# Patient Record
Sex: Female | Born: 1950 | Race: White | Hispanic: No | Smoking: Former smoker
Health system: Southern US, Community
[De-identification: ages and names within clinical notes are randomized; demographics above are authoritative.]

## PROBLEM LIST (undated history)

## (undated) DIAGNOSIS — IMO0002 Reserved for concepts with insufficient information to code with codable children: Secondary | ICD-10-CM

## (undated) DIAGNOSIS — K219 Gastro-esophageal reflux disease without esophagitis: Secondary | ICD-10-CM

## (undated) DIAGNOSIS — R569 Unspecified convulsions: Secondary | ICD-10-CM

## (undated) DIAGNOSIS — I639 Cerebral infarction, unspecified: Secondary | ICD-10-CM

## (undated) DIAGNOSIS — K121 Other forms of stomatitis: Secondary | ICD-10-CM

## (undated) DIAGNOSIS — G459 Transient cerebral ischemic attack, unspecified: Secondary | ICD-10-CM

## (undated) DIAGNOSIS — F419 Anxiety disorder, unspecified: Secondary | ICD-10-CM

## (undated) DIAGNOSIS — E785 Hyperlipidemia, unspecified: Secondary | ICD-10-CM

## (undated) DIAGNOSIS — T7840XA Allergy, unspecified, initial encounter: Secondary | ICD-10-CM

## (undated) DIAGNOSIS — J45909 Unspecified asthma, uncomplicated: Secondary | ICD-10-CM

## (undated) DIAGNOSIS — M1990 Unspecified osteoarthritis, unspecified site: Secondary | ICD-10-CM

## (undated) DIAGNOSIS — N189 Chronic kidney disease, unspecified: Secondary | ICD-10-CM

## (undated) DIAGNOSIS — T782XXA Anaphylactic shock, unspecified, initial encounter: Secondary | ICD-10-CM

## (undated) DIAGNOSIS — D649 Anemia, unspecified: Secondary | ICD-10-CM

## (undated) HISTORY — DX: Chronic kidney disease, unspecified: N18.9

## (undated) HISTORY — DX: Hyperlipidemia, unspecified: E78.5

## (undated) HISTORY — DX: Anemia, unspecified: D64.9

## (undated) HISTORY — DX: Unspecified convulsions: R56.9

## (undated) HISTORY — DX: Allergy, unspecified, initial encounter: T78.40XA

## (undated) HISTORY — DX: Cerebral infarction, unspecified: I63.9

## (undated) HISTORY — DX: Reserved for concepts with insufficient information to code with codable children: IMO0002

## (undated) HISTORY — DX: Other forms of stomatitis: K12.1

## (undated) HISTORY — DX: Anxiety disorder, unspecified: F41.9

## (undated) HISTORY — DX: Gastro-esophageal reflux disease without esophagitis: K21.9

## (undated) HISTORY — DX: Unspecified osteoarthritis, unspecified site: M19.90

## (undated) HISTORY — DX: Unspecified asthma, uncomplicated: J45.909

## (undated) HISTORY — DX: Anaphylactic shock, unspecified, initial encounter: T78.2XXA

---

## 1994-07-01 LAB — HM PAP SMEAR: HM Pap smear: NORMAL

## 2018-02-03 LAB — TSH: TSH: 1.7 (ref 0.41–5.90)

## 2018-02-03 LAB — CBC: RBC: 4.2 (ref 3.87–5.11)

## 2018-02-03 LAB — HEPATIC FUNCTION PANEL
ALT: 8 (ref 7–35)
AST: 12 — AB (ref 13–35)
Bilirubin, Total: 0.3

## 2018-02-03 LAB — BASIC METABOLIC PANEL
BUN: 23 — AB (ref 4–21)
CO2: 24 — AB (ref 13–22)
Chloride: 104 (ref 99–108)
Creatinine: 1.5 — AB (ref <=1.1)
Glucose: 107
Potassium: 4.7 (ref 3.4–5.3)
Sodium: 143 (ref 137–147)

## 2018-02-03 LAB — LIPID PANEL
Cholesterol: 329 — AB (ref 0–200)
HDL: 40 (ref 35–70)
Triglycerides: 476 — AB (ref 40–160)

## 2018-02-03 LAB — CBC AND DIFFERENTIAL
HCT: 38 (ref 36–46)
Hemoglobin: 12.1 (ref 12.0–16.0)
Platelets: 302 (ref 150–399)
WBC: 7.4

## 2018-02-03 LAB — COMPREHENSIVE METABOLIC PANEL
Albumin: 4.9 (ref 3.5–5.0)
Calcium: 9.7 (ref 8.7–10.7)

## 2018-10-12 DIAGNOSIS — R197 Diarrhea, unspecified: Secondary | ICD-10-CM | POA: Insufficient documentation

## 2018-10-22 DIAGNOSIS — D72829 Elevated white blood cell count, unspecified: Secondary | ICD-10-CM | POA: Insufficient documentation

## 2018-10-22 DIAGNOSIS — E8729 Other acidosis: Secondary | ICD-10-CM | POA: Insufficient documentation

## 2018-10-22 DIAGNOSIS — E870 Hyperosmolality and hypernatremia: Secondary | ICD-10-CM | POA: Insufficient documentation

## 2018-10-22 DIAGNOSIS — D75839 Thrombocytosis, unspecified: Secondary | ICD-10-CM | POA: Insufficient documentation

## 2018-10-28 DIAGNOSIS — F3110 Bipolar disorder, current episode manic without psychotic features, unspecified: Secondary | ICD-10-CM | POA: Insufficient documentation

## 2018-10-28 DIAGNOSIS — R109 Unspecified abdominal pain: Secondary | ICD-10-CM | POA: Insufficient documentation

## 2019-05-31 ENCOUNTER — Ambulatory Visit (INDEPENDENT_AMBULATORY_CARE_PROVIDER_SITE_OTHER): Payer: Medicare Other | Admitting: Family Medicine

## 2019-05-31 ENCOUNTER — Other Ambulatory Visit: Payer: Self-pay

## 2019-05-31 ENCOUNTER — Encounter: Payer: Self-pay | Admitting: Family Medicine

## 2019-05-31 VITALS — BP 131/86 | HR 90 | Temp 98.7°F | Ht 65.0 in | Wt 139.8 lb

## 2019-05-31 DIAGNOSIS — K219 Gastro-esophageal reflux disease without esophagitis: Secondary | ICD-10-CM | POA: Insufficient documentation

## 2019-05-31 DIAGNOSIS — Z0000 Encounter for general adult medical examination without abnormal findings: Secondary | ICD-10-CM

## 2019-05-31 DIAGNOSIS — G40909 Epilepsy, unspecified, not intractable, without status epilepticus: Secondary | ICD-10-CM | POA: Diagnosis not present

## 2019-05-31 DIAGNOSIS — J302 Other seasonal allergic rhinitis: Secondary | ICD-10-CM | POA: Diagnosis not present

## 2019-05-31 DIAGNOSIS — R5383 Other fatigue: Secondary | ICD-10-CM | POA: Insufficient documentation

## 2019-05-31 DIAGNOSIS — G43709 Chronic migraine without aura, not intractable, without status migrainosus: Secondary | ICD-10-CM | POA: Diagnosis not present

## 2019-05-31 NOTE — Patient Instructions (Addendum)
Fasting labwork Take blood pressure early morning

## 2019-05-31 NOTE — Progress Notes (Signed)
New Patient Office Visit  Subjective:  Patient ID: Cassandra Hayden, female    DOB: 06-24-1951  Age: 68 y.o. MRN: [id number]  CC:  Chief Complaint  Patient presents with  . Establish Care  . Hypertension    blood pressure fluctuating     HPI Cassandra AyeCarol Allum presents for asthma-anaphylaxis  to canola oil-allergy testing-epi pen needed-no fast food GERD-prilosec Allergies-Claritin Seizures-tetanic seizures-Magnesium, Calcium, Potassium-altered mental status -sepsis-ICU [place]-3/20 moved to [place]-4/20  Seizure (CMS-HCC) (Primary Dx);  Encephalopathy;  Sinus tachycardia;  High anion gap metabolic acidosis;  Hypernatremia;  HypomagnesemiaSeizure (CMS-HCC) (Primary Dx);  Encephalopathy;  Sinus tachycardia;  High anion gap metabolic acidosis;  Hypernatremia;  Hypomagnesemia Back pain-fx 12/19-sciatica -Gabapentin 100mg  1am, 2 pm  10/27/18-bipolar disorder with suicide threat after husband died. Diagnosed in 1990. +FH dad- bipolar disorder Pt with medication in the past-hospitalized for 6 weeks voluntary-Lamictal started in April-no seizures since July.   Psy -regular visit-monthly, counseling-pt starting a phone counseling-psychology   Diagnoses:  Principal Problem: Bipolar affective disorder, current episode manic (CMS-HCC)   Stressors: [place] passed away February 2020, [place] [facility] [place] [facility] [place], does not like current living situation, conflict with adult children  Patient and patient's family/support system have been informed of the commitment process at [facility]. They are aware that, if petitioned for commitment, the patient will be referred to all available psychiatric facilities which may result in out of county hospitalization  PLAN:  -- Safety Concerns: We recommend that, following any necessary medical clearance, the patient be admitted to an inpatient psychiatric unit for safety, stabilization and treatment.  -- Disposition: Will admit to Available Psychiatric  Facility.  -- Admission Status: Involuntary- This has been explained to the patient or appropriate surrogate. 1st QPE completed; please call hospital police if patient attempts to leave..  -- Further Work-up: Deferred  -- Psychiatric Interventions:   - Continue currently prescribed Trazodone 50mg  PO qHS to target insomnia - Continue currently prescribed Atarax 25mg  PO q6h PRN to target anxiety - HOLD Xanax 0.25mg  PO qHS     Past Medical History:  Diagnosis Date  . Anxiety   . Arthritis   . Asthma   . GERD (gastroesophageal reflux disease)   . Seizures (HCC)   . Ulcer (traumatic) of oral mucosa abdominal    PSH-oral surgery  Family History  Problem Relation Age of Onset  . Cancer Mother   . Hyperlipidemia Mother   . Hypertension Mother   . Heart disease Father   . Mental illness Father     died of pancreatic cancer-Mom Died from heart disease-Dad Social History  Live alone-6 children-no longer living at home Socioeconomic History  . Marital status: Widowed    Spouse name: Not on file  . Number of children: Not on file  . Years of education: Not on file  . Highest education level: Not on file  Occupational History  . Occupation: retired  Engineer, productionocial Needs  . Financial resource strain: Not on file  . Food insecurity    Worry: Not on file    Inability: Not on file  . Transportation needs    Medical: Not on file    Non-medical: Not on file  Tobacco Use  . Smoking status: Never Smoker  . Smokeless tobacco: Never Used  Substance and Sexual Activity  . Alcohol use: Never    Frequency: Never  . Drug use: Never  . Sexual activity: Not Currently  Lifestyle  . Physical activity    Days per week: Not  on file    Minutes per session: Not on file  . Stress: Not on file  Relationships  . Social Herbalist on phone: Not on file    Gets together: Not on file    Attends religious service: Not on file    Active member of club or organization: Not on file     Attends meetings of clubs or organizations: Not on file    Relationship status: Not on file  . Intimate partner violence    Fear of current or ex partner: Not on file    Emotionally abused: Not on file    Physically abused: Not on file    Forced sexual activity: Not on file  Other Topics Concern  . Not on file  Social History Narrative  . Not on file    ROS Review of Systems  Constitutional: Positive for fatigue.  HENT: Negative.   Eyes: Negative.   Respiratory: Negative.   Gastrointestinal: Negative.   Endocrine: Negative.   Genitourinary: Negative.   Musculoskeletal: Positive for arthralgias, back pain and myalgias.  Skin: Negative.   Allergic/Immunologic: Positive for environmental allergies and food allergies.  Neurological: Positive for seizures and headaches.       Tetanic    Objective:   Today's Vitals: BP 131/86 (BP Location: Left Arm, Patient Position: Sitting, Cuff Size: Normal)   Pulse 90   Temp 98.7 F (37.1 C) (Oral)   Ht 5\' 5"  (1.651 m)   Wt 139 lb 12.8 oz (63.4 kg)   SpO2 94%   BMI 23.26 kg/m   Physical Exam Vitals signs reviewed.  Constitutional:      Appearance: Normal appearance.  HENT:     Head: Normocephalic and atraumatic.  Neck:     Musculoskeletal: Normal range of motion and neck supple.  Cardiovascular:     Rate and Rhythm: Normal rate and regular rhythm.     Pulses: Normal pulses.     Heart sounds: Normal heart sounds.  Pulmonary:     Effort: Pulmonary effort is normal.     Breath sounds: Normal breath sounds.  Musculoskeletal: Normal range of motion.  Neurological:     Mental Status: She is alert and oriented to person, place, and time.  Psychiatric:        Mood and Affect: Mood normal.        Behavior: Behavior normal.     Assessment & Plan:  1. Seizure disorder ([place]) Seen by neuro-lamictal 2. Chronic migraine without aura without status migrainosus, not intractable Propranolol-stable-decrease in frequency and  intensity  3. Seasonal allergic rhinitis, unspecified trigger claritin  4. Gastroesophageal reflux disease without esophagitis Omeprazole-rx-stable - COMPLETE METABOLIC PANEL WITH GFR - CBC with Differential  5. Hypomagnesemia - COMPLETE METABOLIC PANEL WITH GFR - Magnesium  6. Health care maintenance - Lipid panel  7. Fatigue, unspecified type - TSH Outpatient Encounter Medications as of 05/31/2019  Medication Sig  . B Complex-C (SUPER B COMPLEX PO) Take by mouth.  . Cholecalciferol (D-3-5) 125 MCG (5000 UT) capsule Take 5,000 Units by mouth daily.  Marland Kitchen gabapentin (NEURONTIN) 100 MG capsule Take 100 mg by mouth 2 (two) times daily.  Marland Kitchen lamoTRIgine (LAMICTAL) 100 MG tablet Take 100 mg by mouth daily.  Marland Kitchen LORATADINE PO Take 10 mg by mouth daily.  . magnesium gluconate (MAGONATE) 500 MG tablet Take 500 mg by mouth 2 (two) times daily.  . Multiple Vitamins-Minerals (MULTIVITAMIN WITH MINERALS) tablet Take 1 tablet by mouth daily.  Marland Kitchen  omeprazole (PRILOSEC) 20 MG capsule Take 20 mg by mouth daily.  . propranolol (INDERAL) 80 MG tablet Take 80 mg by mouth daily.  Marland Kitchen tiZANidine (ZANAFLEX) 4 MG capsule Take 4 mg by mouth 3 (three) times daily.  . vitamin E 400 UNIT capsule Take 400 Units by mouth daily.   No facility-administered encounter medications on file as of 05/31/2019.     Follow-up:  LISA Mat Carne, MD

## 2019-06-22 LAB — COMPLETE METABOLIC PANEL WITH GFR
AG Ratio: 1.7 (calc) (ref 1.0–2.5)
ALT: 9 U/L (ref 6–29)
AST: 15 U/L (ref 10–35)
Albumin: 4.3 g/dL (ref 3.6–5.1)
Alkaline phosphatase (APISO): 52 U/L (ref 37–153)
BUN/Creatinine Ratio: 11 (calc) (ref 6–22)
BUN: 16 mg/dL (ref 7–25)
CO2: 25 mmol/L (ref 20–32)
Calcium: 9.9 mg/dL (ref 8.6–10.4)
Chloride: 107 mmol/L (ref 98–110)
Creat: 1.41 mg/dL — ABNORMAL HIGH (ref 0.60–0.93)
GFR, Est African American: 44 mL/min/{1.73_m2} — ABNORMAL LOW (ref >=60)
GFR, Est Non African American: 38 mL/min/{1.73_m2} — ABNORMAL LOW (ref >=60)
Globulin: 2.6 g/dL (calc) (ref 1.9–3.7)
Glucose, Bld: 91 mg/dL (ref 65–99)
Potassium: 3.6 mmol/L (ref 3.5–5.3)
Sodium: 144 mmol/L (ref 135–146)
Total Bilirubin: 0.6 mg/dL (ref 0.2–1.2)
Total Protein: 6.9 g/dL (ref 6.1–8.1)

## 2019-06-22 LAB — LIPID PANEL
Cholesterol: 296 mg/dL — ABNORMAL HIGH (ref <200)
HDL: 43 mg/dL — ABNORMAL LOW (ref >=50)
LDL Cholesterol (Calc): 208 mg/dL (calc) — ABNORMAL HIGH
Non-HDL Cholesterol (Calc): 253 mg/dL (calc) — ABNORMAL HIGH (ref <130)
Total CHOL/HDL Ratio: 6.9 (calc) — ABNORMAL HIGH (ref <5.0)
Triglycerides: 252 mg/dL — ABNORMAL HIGH (ref <150)

## 2019-06-22 LAB — CBC WITH DIFFERENTIAL/PLATELET
Absolute Monocytes: 428 cells/uL (ref 200–950)
Basophils Absolute: 41 cells/uL (ref 0–200)
Basophils Relative: 0.6 %
Eosinophils Absolute: 317 cells/uL (ref 15–500)
Eosinophils Relative: 4.6 %
HCT: 34.2 % — ABNORMAL LOW (ref 35.0–45.0)
Hemoglobin: 11.3 g/dL — ABNORMAL LOW (ref 11.7–15.5)
Lymphs Abs: 2139 cells/uL (ref 850–3900)
MCH: 28 pg (ref 27.0–33.0)
MCHC: 33 g/dL (ref 32.0–36.0)
MCV: 84.9 fL (ref 80.0–100.0)
MPV: 9.3 fL (ref 7.5–12.5)
Monocytes Relative: 6.2 %
Neutro Abs: 3974 cells/uL (ref 1500–7800)
Neutrophils Relative %: 57.6 %
Platelets: 292 10*3/uL (ref 140–400)
RBC: 4.03 10*6/uL (ref 3.80–5.10)
RDW: 13.4 % (ref 11.0–15.0)
Total Lymphocyte: 31 %
WBC: 6.9 10*3/uL (ref 3.8–10.8)

## 2019-06-22 LAB — TSH: TSH: 0.77 mIU/L (ref 0.40–4.50)

## 2019-06-22 LAB — MAGNESIUM: Magnesium: 1.8 mg/dL (ref 1.5–2.5)

## 2019-06-23 ENCOUNTER — Telehealth (INDEPENDENT_AMBULATORY_CARE_PROVIDER_SITE_OTHER): Payer: Medicare Other | Admitting: Family Medicine

## 2019-06-23 ENCOUNTER — Other Ambulatory Visit: Payer: Self-pay

## 2019-06-23 ENCOUNTER — Other Ambulatory Visit: Payer: Self-pay | Admitting: Family Medicine

## 2019-06-23 VITALS — BP 84/47 | Temp 98.6°F | Wt 124.0 lb

## 2019-06-23 DIAGNOSIS — E7849 Other hyperlipidemia: Secondary | ICD-10-CM

## 2019-06-23 MED ORDER — PROPRANOLOL HCL 40 MG PO TABS: 40.0000 mg | ORAL_TABLET | Freq: Three times a day (TID) | ORAL | 1 refills | Status: DC

## 2019-06-23 NOTE — Telephone Encounter (Signed)
Requested medication (s) are due for refill today: yes  Requested medication (s) are on the active medication list: yes Last refill:  06/23/2019  Future visit scheduled: yes  Notes to clinic:  requesting 90 day supply   Requested Prescriptions  Pending Prescriptions Disp Refills   propranolol (INDERAL) 40 MG tablet [Pharmacy Med Name: PROPRANOLOL 40MG  TABLETS] 270 tablet     Sig: TAKE 1 TABLET(40 MG) BY MOUTH THREE TIMES DAILY      There is no refill protocol information for this order

## 2019-06-24 DIAGNOSIS — E7849 Other hyperlipidemia: Secondary | ICD-10-CM | POA: Insufficient documentation

## 2019-06-24 NOTE — Progress Notes (Signed)
Virtual Visit via Telephone Note  I connected with Henrene Pastor on 06/23/19 at 10:00 AM EST by telephone and verified that I am speaking with the correct person using two identifiers.DOB/Address  Location: Patient: home Provider: office   I discussed the limitations, risks, security and privacy concerns of performing an evaluation and management service by telephone and the availability of in person appointments. I also discussed with the patient that there may be a patient responsible charge related to this service. The patient expressed understanding and agreed to proceed.   History of Present Illness: Elevated cholesterol noted on labwork   Observations/Objective: Reviewed labwork Assessment and Plan:  1. Other hyperlipidemia Pt declines medication-states" I will not take statins" -cardiac risk discussed-+FH elevated cholesterol Follow Up Instructions: Suggest DEXA/cologuard-pt declined colonoscopy referral   I discussed the assessment and treatment plan with the patient. The patient was provided an opportunity to ask questions and all were answered. The patient declines treatment  I provided 8 minutes of non-face-to-face time during this encounter.   Aiko Belko Hannah Beat, MD

## 2019-08-02 ENCOUNTER — Ambulatory Visit: Payer: Medicare Other | Attending: Internal Medicine

## 2019-08-02 ENCOUNTER — Other Ambulatory Visit: Payer: Self-pay

## 2019-08-02 DIAGNOSIS — Z20822 Contact with and (suspected) exposure to covid-19: Secondary | ICD-10-CM

## 2019-08-03 LAB — NOVEL CORONAVIRUS, NAA: SARS-CoV-2, NAA: NOT DETECTED

## 2019-11-15 DIAGNOSIS — N185 Chronic kidney disease, stage 5: Secondary | ICD-10-CM | POA: Insufficient documentation

## 2019-11-15 DIAGNOSIS — J302 Other seasonal allergic rhinitis: Secondary | ICD-10-CM | POA: Insufficient documentation

## 2019-11-15 DIAGNOSIS — Z87891 Personal history of nicotine dependence: Secondary | ICD-10-CM | POA: Insufficient documentation

## 2019-11-15 DIAGNOSIS — K279 Peptic ulcer, site unspecified, unspecified as acute or chronic, without hemorrhage or perforation: Secondary | ICD-10-CM | POA: Insufficient documentation

## 2019-11-16 DIAGNOSIS — I2510 Atherosclerotic heart disease of native coronary artery without angina pectoris: Secondary | ICD-10-CM | POA: Insufficient documentation

## 2019-11-29 ENCOUNTER — Ambulatory Visit: Payer: Medicare Other | Admitting: Family Medicine

## 2019-12-01 ENCOUNTER — Other Ambulatory Visit: Payer: Self-pay | Admitting: Family Medicine

## 2019-12-01 DIAGNOSIS — Z780 Asymptomatic menopausal state: Secondary | ICD-10-CM

## 2019-12-01 DIAGNOSIS — Z13820 Encounter for screening for osteoporosis: Secondary | ICD-10-CM

## 2020-02-16 ENCOUNTER — Other Ambulatory Visit: Payer: Medicare Other

## 2020-08-08 DIAGNOSIS — D8989 Other specified disorders involving the immune mechanism, not elsewhere classified: Secondary | ICD-10-CM | POA: Insufficient documentation

## 2021-03-12 ENCOUNTER — Encounter (HOSPITAL_COMMUNITY): Payer: Self-pay | Admitting: *Deleted

## 2021-03-12 ENCOUNTER — Other Ambulatory Visit: Payer: Self-pay

## 2021-03-12 ENCOUNTER — Inpatient Hospital Stay (HOSPITAL_COMMUNITY)
Admission: EM | Admit: 2021-03-12 | Discharge: 2021-03-13 | DRG: 065 | Discharge status: Against Medical Advice | Payer: Medicare Other | Attending: Internal Medicine | Admitting: Internal Medicine

## 2021-03-12 ENCOUNTER — Emergency Department (HOSPITAL_COMMUNITY): Payer: Medicare Other

## 2021-03-12 DIAGNOSIS — N1831 Chronic kidney disease, stage 3a: Secondary | ICD-10-CM | POA: Diagnosis present

## 2021-03-12 DIAGNOSIS — Z20822 Contact with and (suspected) exposure to covid-19: Secondary | ICD-10-CM | POA: Diagnosis present

## 2021-03-12 DIAGNOSIS — Z8249 Family history of ischemic heart disease and other diseases of the circulatory system: Secondary | ICD-10-CM

## 2021-03-12 DIAGNOSIS — Z888 Allergy status to other drugs, medicaments and biological substances status: Secondary | ICD-10-CM

## 2021-03-12 DIAGNOSIS — Z882 Allergy status to sulfonamides status: Secondary | ICD-10-CM

## 2021-03-12 DIAGNOSIS — G43909 Migraine, unspecified, not intractable, without status migrainosus: Secondary | ICD-10-CM | POA: Diagnosis present

## 2021-03-12 DIAGNOSIS — R29810 Facial weakness: Secondary | ICD-10-CM | POA: Diagnosis present

## 2021-03-12 DIAGNOSIS — N1832 Chronic kidney disease, stage 3b: Secondary | ICD-10-CM | POA: Diagnosis present

## 2021-03-12 DIAGNOSIS — E785 Hyperlipidemia, unspecified: Secondary | ICD-10-CM | POA: Diagnosis present

## 2021-03-12 DIAGNOSIS — Z5329 Procedure and treatment not carried out because of patient's decision for other reasons: Secondary | ICD-10-CM | POA: Diagnosis present

## 2021-03-12 DIAGNOSIS — I639 Cerebral infarction, unspecified: Secondary | ICD-10-CM | POA: Diagnosis not present

## 2021-03-12 DIAGNOSIS — Z83438 Family history of other disorder of lipoprotein metabolism and other lipidemia: Secondary | ICD-10-CM

## 2021-03-12 DIAGNOSIS — R471 Dysarthria and anarthria: Secondary | ICD-10-CM | POA: Diagnosis present

## 2021-03-12 DIAGNOSIS — Z880 Allergy status to penicillin: Secondary | ICD-10-CM

## 2021-03-12 DIAGNOSIS — I6330 Cerebral infarction due to thrombosis of unspecified cerebral artery: Secondary | ICD-10-CM | POA: Insufficient documentation

## 2021-03-12 DIAGNOSIS — I6381 Other cerebral infarction due to occlusion or stenosis of small artery: Secondary | ICD-10-CM | POA: Diagnosis not present

## 2021-03-12 DIAGNOSIS — Z91041 Radiographic dye allergy status: Secondary | ICD-10-CM

## 2021-03-12 DIAGNOSIS — I160 Hypertensive urgency: Secondary | ICD-10-CM | POA: Diagnosis not present

## 2021-03-12 DIAGNOSIS — I129 Hypertensive chronic kidney disease with stage 1 through stage 4 chronic kidney disease, or unspecified chronic kidney disease: Secondary | ICD-10-CM | POA: Diagnosis present

## 2021-03-12 DIAGNOSIS — Z79899 Other long term (current) drug therapy: Secondary | ICD-10-CM

## 2021-03-12 DIAGNOSIS — D631 Anemia in chronic kidney disease: Secondary | ICD-10-CM | POA: Diagnosis present

## 2021-03-12 DIAGNOSIS — K219 Gastro-esophageal reflux disease without esophagitis: Secondary | ICD-10-CM | POA: Diagnosis present

## 2021-03-12 DIAGNOSIS — D649 Anemia, unspecified: Secondary | ICD-10-CM | POA: Diagnosis present

## 2021-03-12 DIAGNOSIS — Z91018 Allergy to other foods: Secondary | ICD-10-CM

## 2021-03-12 DIAGNOSIS — N183 Chronic kidney disease, stage 3 unspecified: Secondary | ICD-10-CM | POA: Diagnosis present

## 2021-03-12 DIAGNOSIS — I959 Hypotension, unspecified: Secondary | ICD-10-CM | POA: Diagnosis present

## 2021-03-12 DIAGNOSIS — G40909 Epilepsy, unspecified, not intractable, without status epilepticus: Secondary | ICD-10-CM

## 2021-03-12 DIAGNOSIS — R29704 NIHSS score 4: Secondary | ICD-10-CM | POA: Diagnosis present

## 2021-03-12 DIAGNOSIS — G8191 Hemiplegia, unspecified affecting right dominant side: Secondary | ICD-10-CM | POA: Diagnosis present

## 2021-03-12 DIAGNOSIS — Z8673 Personal history of transient ischemic attack (TIA), and cerebral infarction without residual deficits: Secondary | ICD-10-CM

## 2021-03-12 HISTORY — DX: Transient cerebral ischemic attack, unspecified: G45.9

## 2021-03-12 LAB — CBG MONITORING, ED: Glucose-Capillary: 91 mg/dL (ref 70–99)

## 2021-03-12 LAB — ETHANOL: Alcohol, Ethyl (B): <10 mg/dL (ref <10)

## 2021-03-12 LAB — PROTIME-INR
INR: 1 (ref 0.8–1.2)
Prothrombin Time: 12.8 seconds (ref 11.4–15.2)

## 2021-03-12 LAB — CBC
HCT: 32 % — ABNORMAL LOW (ref 36.0–46.0)
Hemoglobin: 10.1 g/dL — ABNORMAL LOW (ref 12.0–15.0)
MCH: 28.5 pg (ref 26.0–34.0)
MCHC: 31.6 g/dL (ref 30.0–36.0)
MCV: 90.4 fL (ref 80.0–100.0)
Platelets: 264 10*3/uL (ref 150–400)
RBC: 3.54 MIL/uL — ABNORMAL LOW (ref 3.87–5.11)
RDW: 13 % (ref 11.5–15.5)
WBC: 8 10*3/uL (ref 4.0–10.5)
nRBC: 0 % (ref 0.0–0.2)

## 2021-03-12 LAB — COMPREHENSIVE METABOLIC PANEL
ALT: 11 U/L (ref 0–44)
AST: 17 U/L (ref 15–41)
Albumin: 4 g/dL (ref 3.5–5.0)
Alkaline Phosphatase: 65 U/L (ref 38–126)
Anion gap: 7 (ref 5–15)
BUN: 25 mg/dL — ABNORMAL HIGH (ref 8–23)
CO2: 24 mmol/L (ref 22–32)
Calcium: 8.6 mg/dL — ABNORMAL LOW (ref 8.9–10.3)
Chloride: 108 mmol/L (ref 98–111)
Creatinine, Ser: 1.66 mg/dL — ABNORMAL HIGH (ref 0.44–1.00)
GFR, Estimated: 33 mL/min — ABNORMAL LOW (ref >60)
Glucose, Bld: 96 mg/dL (ref 70–99)
Potassium: 3.9 mmol/L (ref 3.5–5.1)
Sodium: 139 mmol/L (ref 135–145)
Total Bilirubin: 0.5 mg/dL (ref 0.3–1.2)
Total Protein: 7 g/dL (ref 6.5–8.1)

## 2021-03-12 LAB — DIFFERENTIAL
Abs Immature Granulocytes: 0.03 10*3/uL (ref 0.00–0.07)
Basophils Absolute: 0.1 10*3/uL (ref 0.0–0.1)
Basophils Relative: 1 %
Eosinophils Absolute: 0.4 10*3/uL (ref 0.0–0.5)
Eosinophils Relative: 5 %
Immature Granulocytes: 0 %
Lymphocytes Relative: 36 %
Lymphs Abs: 2.9 10*3/uL (ref 0.7–4.0)
Monocytes Absolute: 0.7 10*3/uL (ref 0.1–1.0)
Monocytes Relative: 8 %
Neutro Abs: 3.9 10*3/uL (ref 1.7–7.7)
Neutrophils Relative %: 50 %

## 2021-03-12 LAB — APTT: aPTT: 32 seconds (ref 24–36)

## 2021-03-12 IMAGING — CT CT HEAD CODE STROKE
3 series · 15 of 47 positions shown, 18 images · non-contrast
Comparison: None.

CLINICAL DATA: Code stroke.  Sudden onset weakness

EXAM:
CT HEAD WITHOUT CONTRAST
TECHNIQUE: Contiguous axial images were obtained from the base of the skull
through the vertex without intravenous contrast.

[Series 2: head w o · axial · 0.47mm/px · z∈[+68,+192]mm · 9 of 31 slices shown, 12 images]
[im 3/31  brain]
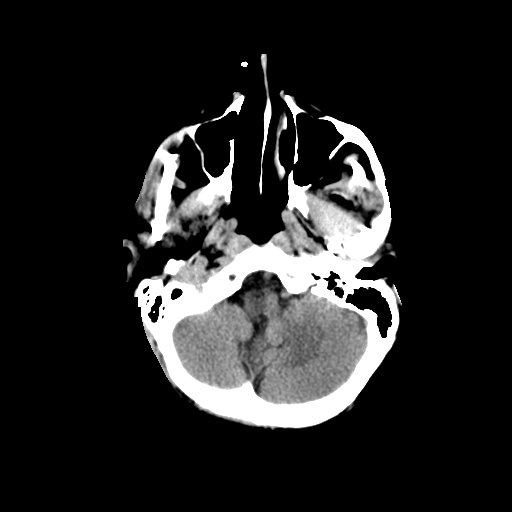
[im 3/31  bone]
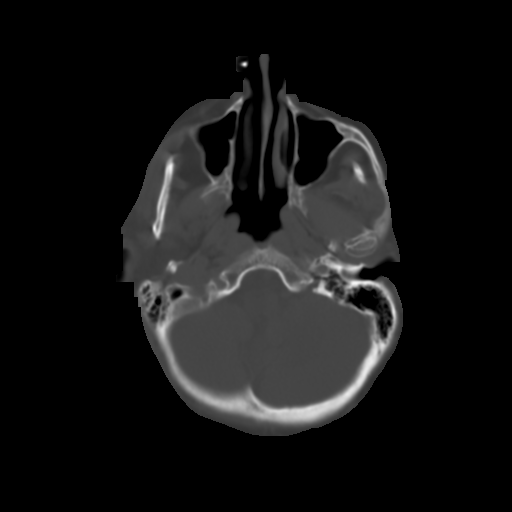
[im 6/31  brain]
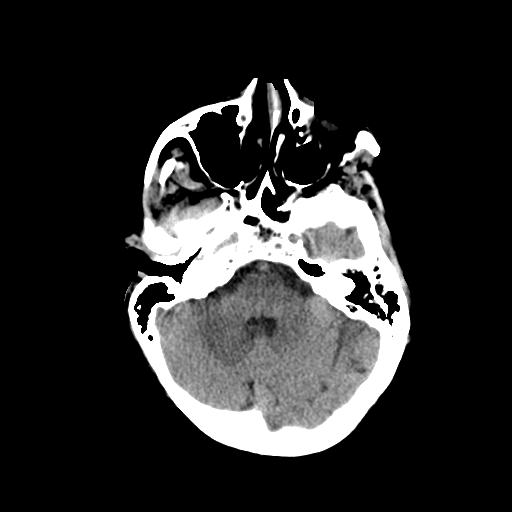
[im 9/31  brain]
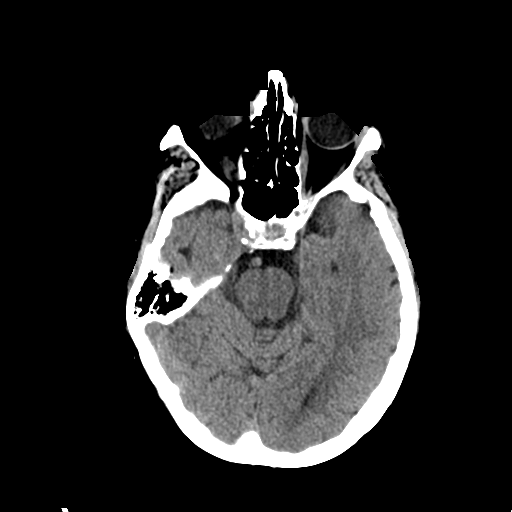
[im 12/31  brain]
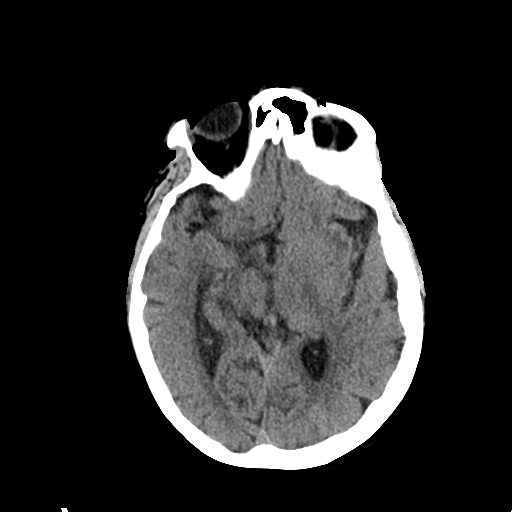
[im 16/31  brain]
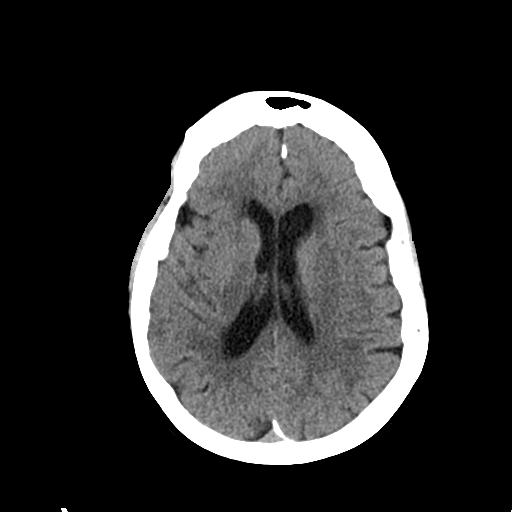
[im 16/31  bone]
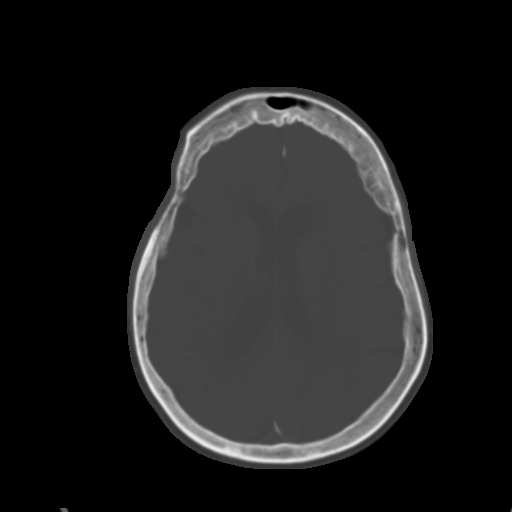
[im 19/31  brain]
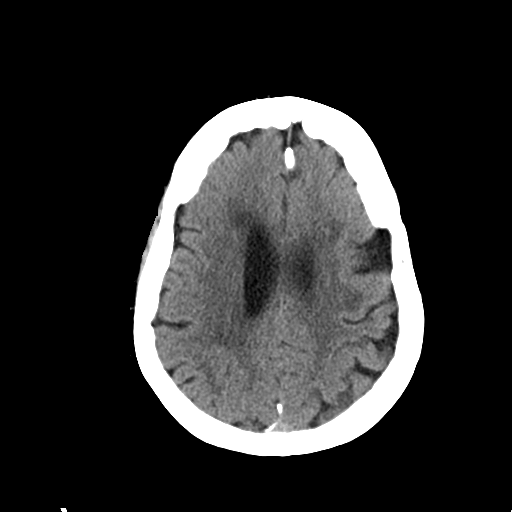
[im 22/31  brain]
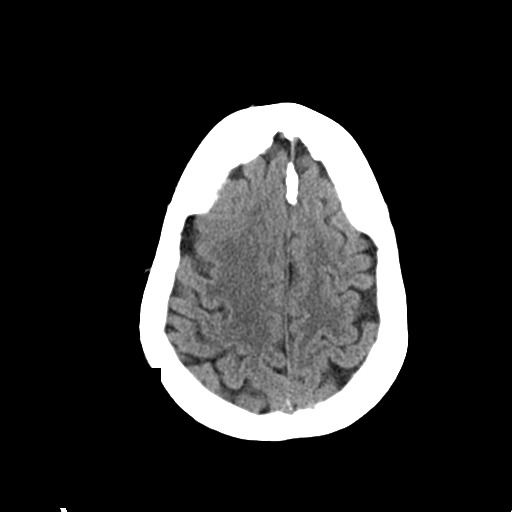
[im 25/31  brain]
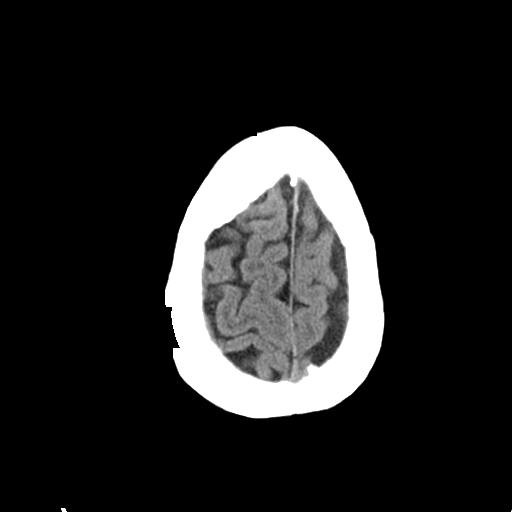
[im 28/31  brain]
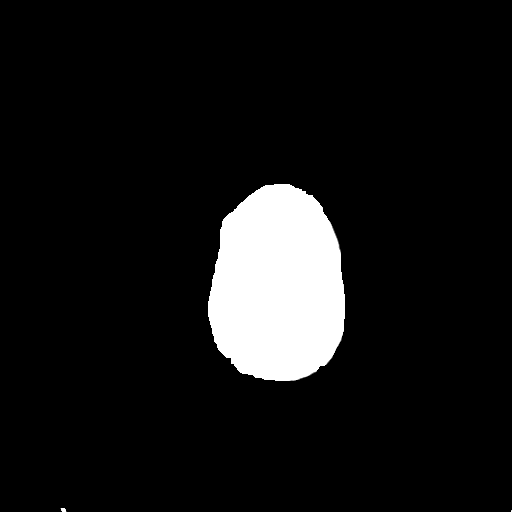
[im 28/31  bone]
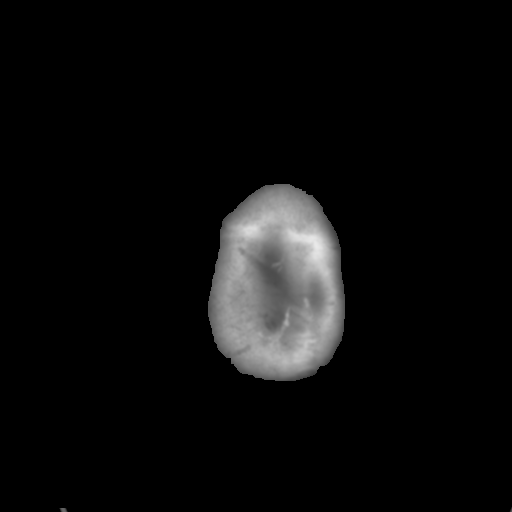

[Series 4: coronal soft · coronal · 0.30mm/px · 3 of 67 slices shown]
[im 23/67  brain]
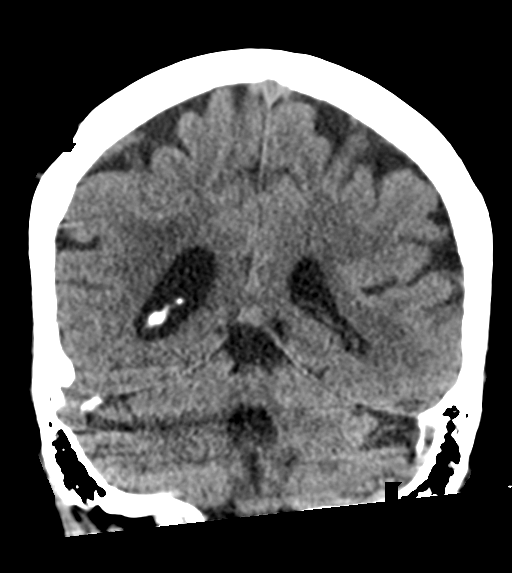
[im 30/67  brain]
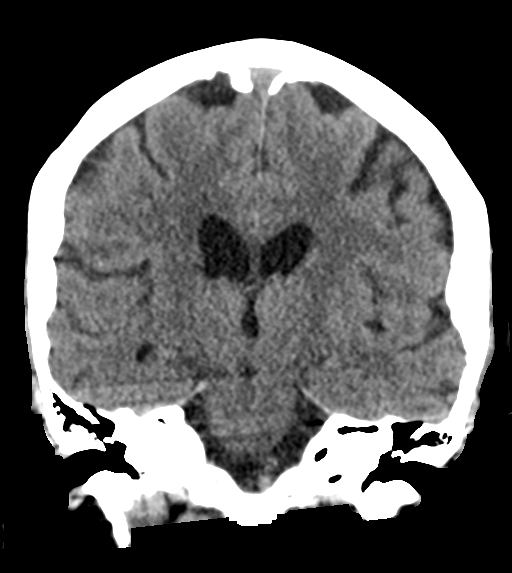
[im 37/67  brain]
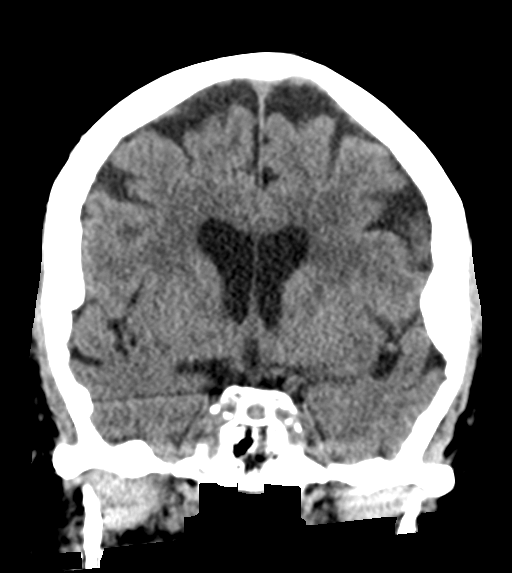

[Series 5: sagittal soft · sagittal · 0.31mm/px · 3 of 49 slices shown]
[im 17/49  brain]
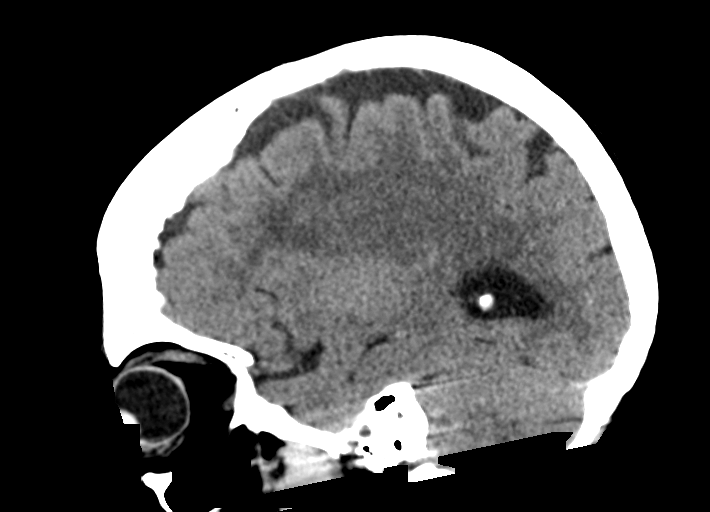
[im 25/49  brain]
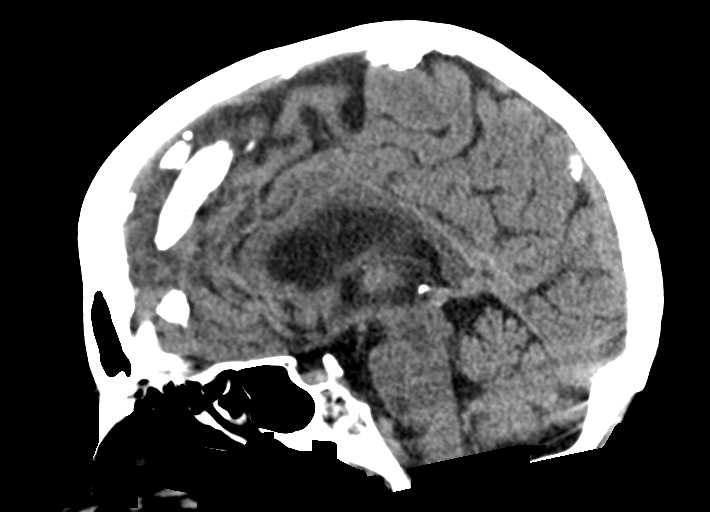
[im 33/49  brain]
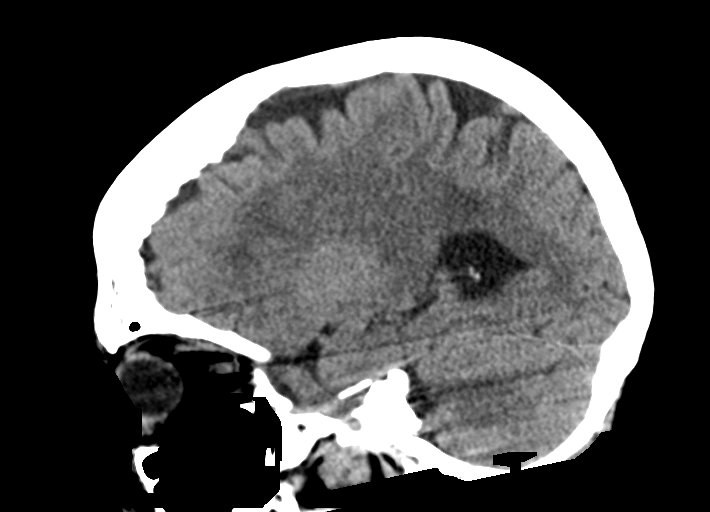

[15 of 47 positions shown; findings below may reference images not displayed]

FINDINGS: Brain: There is no mass, hemorrhage or extra-axial collection. The
size and configuration of the ventricles and extra-axial CSF spaces
are normal. There is hypoattenuation of the periventricular white
matter, most commonly indicating chronic ischemic microangiopathy.

Vascular: No abnormal hyperdensity of the major intracranial
arteries or dural venous sinuses. No intracranial atherosclerosis.

Skull: The visualized skull base, calvarium and extracranial soft
tissues are normal.

Sinuses/Orbits: No fluid levels or advanced mucosal thickening of
the visualized paranasal sinuses. No mastoid or middle ear effusion.
The orbits are normal.

ASPECTS (Alberta Stroke Program Early CT Score)

- Ganglionic level infarction (caudate, lentiform nuclei, internal
capsule, insula, M1-M3 cortex): 7

- Supraganglionic infarction (M4-M6 cortex): 3

Total score (0-10 with 10 being normal): 10
IMPRESSION: 1. Chronic ischemic microangiopathy without acute intracranial
abnormality.
2. ASPECTS is 10.

These results were called by telephone at the time of interpretation
on [DATE] at [DATE] to provider DON LOLITO , who verbally
acknowledged these results.

## 2021-03-12 NOTE — ED Notes (Signed)
SSS not completed r/t NPO for MRI

## 2021-03-12 NOTE — ED Triage Notes (Signed)
Pt with slurred speech and difficultly with walking  about an 1.5 -2 hours.  Lips feel funny and unable to make fist on right

## 2021-03-12 NOTE — Consult Note (Addendum)
Neurology Consultation Reason for Consult: Code stroke  Requesting Physician: Kennis Carina   CC: slurred speech, difficulty walking and right arm weakness   History is obtained from: Patient and chart review, Dr. Pilar Plate  HPI: Cassandra Hayden is a 70 y.o. female with a past medical history significant for seizures, uncontrolled unmedicated hyperlipidemia, TIA, anxiety, iodinated contrast allergy.  She reports she was in her usual state of health until 9 PM -- she had an episode of dizziness at 8:30 PM associated with low BP (70s/40s) which is not unusual for her and at the time she had no focal weakness/numbness. At 9 PM she got out of bed and was trying to drink some fluids to help raise her BP and noticed she was weak on the right side. Denies HA (reports this is unusual as she typically has a constant headache).   She was evaluated by teleneurology and refused TNKase in the setting of not being willing to accept the bleeding risk.  Deficits as reported at this time are right upper extremity weakness and slurred speech, with a total NIH score stroke scale of 4 (one-point for facial droop, one-point for right arm drift, one-point for right leg drift, one-point for dysarthria).  Additional imaging was recommended to exclude large vessel occlusion but given contrast allergy patient again refused and she is being emergently transferred to [facility] for MRI brain and MRA head/neck from [facility] per teleneurologist evaluation and concern that her blurry vision may represent LVO.    Initial vitals on arrival to [facility]: Heart rates 60s to 70s, respiratory rate 16, blood pressure 124/73 (149/63 on later check), 100% on room air, 66.9 kgs. En route per [facility] nicardipine was started for BP goal of 160-180 mmHg which was discontinued on her arrival. She had some brief bilateral blurry vision at the edges of her vision, and initially on my evaluation had bilateral LE drift but with  encouragement the left sided drift resolved and her NIH remained 4.   There was significant delay in MRI due to difficulty removing earrings she reported she had not taken out in 6 years. She additionally refused MRI contrast while in scanner, which we later discussed is not the same contrast as iodine contrast and she reports she has not had gadolinium allergy.  On further discussion regarding her contrast allergy, she reports she still feels like she is drowning even after pretreatment and therefore she does not want iodinated contrast.she notes if her neurological status was to decline and she were to meet criteria for thrombectomy she would want to proceed with thrombectomy, including pretreatment for dye allergy and intubation for the procedure with extubation only once her respiratory status was completely stabilized from the potential of an anaphylactic reaction.  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  She has very strong and clear preferences about which medical procedures she is willing and unwilling to undergo and is able to clearly communicate these preferences.  We did discuss that thrombectomy would put her at risk of needing dialysis again due to her borderline renal function and the amount of contrast dye needed for such a procedure.  Regarding her seizure history she has "electrolyte imbalance seizures" without any recent seizure activity and  had had bleeding stomach ulcer 1 month ago (bloody stool, with severe GI infection in 2020 and ulcers on endoscopy in 2005).   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  ROS: All other review of systems was negative except as noted in the HPI.   Past Medical History:  Diagnosis Date   Anxiety    Arthritis    Asthma    GERD (gastroesophageal reflux disease)    Seizures (HCC)    TIA (transient ischemic attack)    Ulcer (traumatic) of oral mucosa abdominal   History reviewed. No pertinent surgical history.  Current Outpatient Medications  Medication Instructions   B Complex-C (SUPER B COMPLEX PO) Oral   D-3-5 5,000 Units, Oral, Daily   gabapentin (NEURONTIN) 100 mg, Oral, 2 times daily   lamoTRIgine (LAMICTAL) 100 mg, Oral, Daily   LORATADINE PO 10 mg, Oral, Daily   magnesium gluconate (MAGONATE) 500 mg, Oral, 2 times daily   Multiple Vitamins-Minerals (MULTIVITAMIN WITH MINERALS) tablet 1 tablet, Oral, Daily   omeprazole (PRILOSEC) 20 mg, Oral, Daily   propranolol (INDERAL) 40 mg, Oral, 3 times daily   tiZANidine (ZANAFLEX) 4 mg, Oral, 3 times daily   vitamin E 400 Units, Oral, Daily   Notes she does not take cholesterol medication "because I don't want to;" denies prior side effects  Family History  Problem Relation Age of Onset   Cancer Mother    Hyperlipidemia Mother    Hypertension Mother    Heart disease Father    Mental illness Father   -Hyperlipidemia in her son as well   Social History:  reports that she has never smoked. She has never used smokeless tobacco. She reports that she does not drink alcohol and does not use drugs.   Exam: Current vital signs: BP (!) 149/63   Pulse 63   Temp 97.9 F (36.6 C) (Oral)   Resp 18   Ht 5\' 6"  (1.676  m)   Wt 66.9 kg   SpO2 100%   BMI 23.79 kg/m  Vital signs in last 24 hours: Temp:  [97.9 F (36.6 C)] 97.9 F (36.6 C) (09/12 2301) Pulse Rate:  [63-71] 63 (09/12 2330) Resp:  [16-18] 18 (09/12 2330) BP: (124-149)/(63-73) 149/63 (09/12 2330) SpO2:  [100 %] 100 % (09/12 2330) Weight:  [66.9 kg] 66.9 kg (09/12 2314)   Physical Exam  Constitutional: Appears well-developed and well-nourished.  Psych: Affect appropriate to situation, calm and cooperative though often tangential in answers to questions Eyes: No scleral injection HENT: No oropharyngeal obstruction.  MSK: no joint deformities.  Cardiovascular: Normal rate and regular rhythm.  Respiratory: Effort normal, non-labored breathing GI: Soft.  No distension. There is no tenderness.  Skin: Warm dry and intact visible skin  Neuro: Mental Status: Patient is awake, alert, oriented to person, place, month, year, and situation. Patient is able to give a clear and coherent history. No signs of aphasia or neglect Cranial  Nerves: II: Visual Fields are full. Pupils are equal, round, and reactive to light.   III,IV, VI: EOMI without ptosis or diploplia.  V: Facial sensation is symmetric to light touch VII: Facial movement is notable for right facial droop VIII: hearing is intact to voice X: Uvula elevates symmetrically XI: Shoulder shrug is symmetric. XII: tongue is midline without atrophy or fasciculations.  Motor: Tone is normal. Bulk is normal. RUE positive pronator drift. Initially bilateral drift of the lower extremities, then just of the RLE.  Sensory: Sensation is symmetric to light touch  in the arms and legs. Deep Tendon Reflexes: 2+ and symmetric in the biceps and patellae.  Cerebellar: FNF and HKS are intact bilaterally  NIHSS total 4 preformed just prior to MRI brain Score breakdown: 1 point each for facial droop, RUE drift, RLE drift and dysarthria  I have reviewed labs in epic and the results pertinent to this  consultation are: CBC notable for hemoglobin of 10.1 (12.1 in 2019 and 11.3 06/21/2019) Glucose 91 CMP with creatinine of 1.66 (baseline 1.4-1.5), BUN 25, GFR 33 PT/INR/PTT within normal limits Ethanol negative  Respiratory viral panel pending  Lab Results  Component Value Date   CHOL 296 (H) 06/21/2019   HDL 43 (L) 06/21/2019   LDLCALC 208 (H) 06/21/2019   TRIG 252 (H) 06/21/2019   CHOLHDL 6.9 (H) 06/21/2019    I have reviewed the images obtained:  Head CT personally reviewed, chronic microangiopathy without clear acute intracranial process though certainly one could be overlooked in the setting of her white matter disease  MRI brain personally reviewed -- acute left pontine stroke MRA brain personally reviewed --there is a short segment right P1 loss of signal with distal clear flow, possible chronic occlusion versus severe stenosis given patient is not symptomatic from this; additionally there is severe stenosis of the proximal left P2 segment.  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  Severe intracranial atherosclerotic disease as detailed above, with a major risk factor being uncontrolled hyperlipidemia, likely secondary to strong genetic predisposition.  At this time patient does not meet criteria for thrombectomy given her low NIH stroke scale, for which we will take a closely watchful approach in addition to starting dual antiplatelet therapy and statin and allowing for permissive hypertension as below  Recommendations: # Left pontine stroke secondary to atheroembolic etiology versus small vessel disease - Stroke labs TSH, RPR, HgbA1c, fasting lipid panel - Frequent neuro checks, every 1 hour for the next 6 hours given her right P1 occlusion, nursing personally instructed to reactivate code stroke for any acute neurological change and will  reconsider thrombectomy in that setting - Echocardiogram - Prophylactic therapy-Antiplatelet med: Aspirin - dose 325mg  PO or 300mg  PR, followed by 81 mg daily - Plavix 300 mg load with 75 mg daily for 90 day course  - Atorvastatin 80 mg nightly, on an outpatient basis will additionally likely need a PSK 9 inhibitor such as Repatha - Risk factor modification, diet, exercise and medication adherence to statin and likely PSK 9 inhibitor discussed with patient - Telemetry monitoring; 30 day event monitor on discharge if no arrythmias captured  - Blood pressure goal   - Permissive hypertension to 220/120 - Cartoid duplex given non-diagnostic MRA neck and contrast allergy - PT consult, OT consult, Speech consult - Stroke team to follow in consultation, please admit for full  stroke workup  Brooke Dare MD-PhD [facility] [phone] Available 7 PM to 7 AM, outside of these hours please call Neurologist on call as listed on Amion.   Total critical care time: 45 minutes   Critical care time was exclusive of separately billable procedures and treating other patients.   Critical care was necessary to treat or prevent imminent or life-threatening deterioration -- emergent evaluation for potential thrombectomy.    Critical care was time spent personally by me on the following activities: development of treatment plan with patient and/or surrogate as well as nursing, discussions with consultants/primary team, evaluation of patient's response to treatment, examination of patient, obtaining history from patient or surrogate, ordering and performing treatments and interventions, ordering and review of laboratory studies, ordering and review of radiographic studies, and re-evaluation of patient's condition as needed, as documented above.

## 2021-03-12 NOTE — ED Notes (Signed)
Patient refuses CT scan even with prep r/t history of anaphylaxis

## 2021-03-12 NOTE — ED Notes (Signed)
CODE STROKE PAGED 

## 2021-03-12 NOTE — ED Notes (Signed)
[facility] to come get pt to take to [facility]

## 2021-03-12 NOTE — Consult Note (Signed)
[facility] Tele[facility]urology Consult Services   Date of Service:   03/12/2021 23:08:11  Diagnosis:       I63.9 - Cerebrovascular accident (CVA), unspecified mechanism (HCC)  Impression:      70 year old female who presents with dizziness, right weakness and slurred speech. NIHSS 4.   Head CT shows no acute changes.    I'm concerned for an acute stroke. She declined TNK (see below). She will not attempt CTA with pretreatment for anaphylaxis. We will transfer her for urgent MRI/A head and neck and involve NIR if LVO on MRA (MRI is not available at this facility at this time).  Metrics: Last Known Well: 03/12/2021 20:30:01 [facility] Notification Time: 03/12/2021 23:08:11 Arrival Time: 03/12/2021 22:52:47 Stamp Time: 03/12/2021 23:08:11 Initial Response Time: 03/12/2021 23:09:43 Symptoms: dizziness. NIHSS Start Assessment Time: 03/12/2021 23:12:53 Patient is not a candidate for Thrombolytic. Thrombolytic Medical Decision: 03/12/2021 23:21:24 Patient was not deemed candidate for Thrombolytic because of following reasons: Patient and/or Family refused. We discussed that she arrived within window for TNK without clear contraindications. I discussed risks and benefits including the risks of cerebral bleeding and bleeding from her recent gastric ulcers. She was adamant she did not want TNK. This was discussed with her by me and ED physician.   CT head showed no acute hemorrhage or acute core infarct.  ED Physician notified of diagnostic impression and management plan on 03/12/2021 23:32:45  Advanced Imaging: Advanced Imaging Not Completed because:  She had anaphylaxis to contrast dye and felt awful on the pretreatment (she completely refused attempting this again). We will transfer her for urgent MRI/A  ------------------------------------------------------------------------------  History of Present Illness: Patient is a 70 year old Female.  Patient was  brought by private transportation with symptoms of dizziness.  70 year old female who developed dizziness and laid down at 2030 then woke up right-sided heaviness. She cannot make a fist with her right hand. The right leg is giving out. Her right lip is numb. She feels her speech is off. I confirmed she had no symptoms earlier in the day.  She denies visual changes or numbness.  There have not been any seizures recently (she has "electrolyte imbalance seizures").  She has bleeding stomach ulcers 2 months ago.  The patient is not on anticoagulation. The patient has not had any recent surgeries. There is no history of bleeding in the brain. There has not been any history of MI, stroke or significant head trauma in the last 3 months. There has not been any heavy alcohol use, cancer or liver disease that might impact platelets or coagulation studies. They have never been told they have low platelets or coagulation problems. There have not been any recent blood infections or IV drug use that might cause endocarditis.    Past Medical History:      There is NO history of Hypertension      There is NO history of Diabetes Mellitus      There is NO history of Hyperlipidemia      There is NO history of Atrial Fibrillation      There is NO history of Stroke  Anticoagulant use:  No  Antiplatelet use: No  Allergies:  Reviewed Allergies Text: Iodine- She had anaphylaxis to contrast dye and felt awful on the pretreatment (she completely refused attempting this again)     Examination: BP(124/73), Pulse(71), Blood Glucose(91) 1A: Level of Consciousness - Alert; keenly responsive + 0 1B: Ask Month and Age - Both Questions Right + 0 1C: Blink  Eyes & Squeeze Hands - Performs Both Tasks + 0 2: Test Horizontal Extraocular Movements - Normal + 0 3: Test Visual Fields - No Visual Loss + 0 4: Test Facial Palsy (Use Grimace if Obtunded) - Minor paralysis (flat nasolabial fold, smile asymmetry) + 1 5A: Test  Left Arm Motor Drift - No Drift for 10 Seconds + 0 5B: Test Right Arm Motor Drift - Drift, but doesn't hit bed + 1 6A: Test Left Leg Motor Drift - No Drift for 5 Seconds + 0 6B: Test Right Leg Motor Drift - Drift, but doesn't hit bed + 1 7: Test Limb Ataxia (FNF/Heel-Shin) - No Ataxia + 0 8: Test Sensation - Normal; No sensory loss + 0 9: Test Language/Aphasia - Normal; No aphasia + 0 10: Test Dysarthria - Mild-Moderate Dysarthria: Slurring but can be understood + 1 11: Test Extinction/Inattention - No abnormality + 0  NIHSS Score: 4   Pre-Morbid Modified Rankin Scale: 0 Points = No symptoms at all   Patient/Family was informed the Neurology Consult would occur via TeleHealth consult by way of interactive audio and video telecommunications and consented to receiving care in this manner.   Patient is being evaluated for possible acute neurologic impairment and high probability of imminent or life-threatening deterioration. I spent total of 30 minutes providing care to this patient, including time for face to face visit via telemedicine, review of medical records, imaging studies and discussion of findings with providers, the patient and/or family.   Dr Ferdinand Cava   [facility] [phone]  Case [id number]

## 2021-03-12 NOTE — ED Provider Notes (Signed)
[facility]-EMERGENCY DEPT [facility] Emergency Department Provider Note MRN:  [id number]  Arrival date & time: 03/12/21     Chief Complaint   Code Stroke   History of Present Illness   Cassandra Hayden is a 70 y.o. year-old female with a history of seizures, TIA, migraines presenting to the ED with chief complaint of code stroke.  That he was feeling normal at 8:30 PM and then started having some cramping in the leg followed by decreased strength to the right arm.  Also noting some speech disturbance and decreased sensation to the lips.  Feeling dizzy.  Denies headache, no recent fever or illness.  Review of Systems  A complete 10 system review of systems was obtained and all systems are negative except as noted in the HPI and PMH.   Patient's Health History    Past Medical History:  Diagnosis Date   Anxiety    Arthritis    Asthma    GERD (gastroesophageal reflux disease)    Seizures (HCC)    TIA (transient ischemic attack)    Ulcer (traumatic) of oral mucosa abdominal    History reviewed. No pertinent surgical history.  Family History  Problem Relation Age of Onset   Cancer Mother    Hyperlipidemia Mother    Hypertension Mother    Heart disease Father    Mental illness Father     Social History   Socioeconomic History   Marital status: Widowed    Spouse name: Not on file   Number of children: Not on file   Years of education: Not on file   Highest education level: Not on file  Occupational History   Occupation: retired  Tobacco Use   Smoking status: Never   Smokeless tobacco: Never  Substance and Sexual Activity   Alcohol use: Never   Drug use: Never   Sexual activity: Not Currently  Other Topics Concern   Not on file  Social History Narrative   Not on file   Social Determinants of Health   Financial Resource Strain: Not on file  Food Insecurity: Not on file  Transportation Needs: Not on file  Physical Activity: Not on file  Stress: Not on file  Social  Connections: Not on file  Intimate Partner Violence: Not on file     Physical Exam   Vitals:   03/12/21 2301  BP: 124/73  Pulse: 71  Resp: 16  Temp: 97.9 F (36.6 C)  SpO2: 100%    CONSTITUTIONAL: Chronically ill-appearing, NAD NEURO:  Alert and oriented x 3, subtle speech impairment, subtle right pronator drift, normal and symmetric sensation, no visual field cuts EYES:  eyes equal and reactive ENT/NECK:  no LAD, no JVD CARDIO: Regular rate, well-perfused, normal S1 and S2 PULM:  CTAB no wheezing or rhonchi GI/GU:  normal bowel sounds, non-distended, non-tender MSK/SPINE:  No gross deformities, no edema SKIN:  no rash, atraumatic PSYCH:  Appropriate speech and behavior  *Additional and/or pertinent findings included in MDM below  Diagnostic and Interventional Summary    EKG Interpretation  Date/Time:  Monday March 12 2021 22:57:31 EDT Ventricular Rate:  68 PR Interval:  178 QRS Duration: 78 QT Interval:  406 QTC Calculation: 431 R Axis:   1 Text Interpretation: Normal sinus rhythm Normal ECG Confirmed by Kennis Carina [phone]) on 03/12/2021 11:35:47 PM       Labs Reviewed  CBC - Abnormal; Notable for the following components:      Result Value   RBC 3.54 (*)  Hemoglobin 10.1 (*)    HCT 32.0 (*)    All other components within normal limits  RESP PANEL BY RT-PCR (FLU A&B, COVID) ARPGX2  DIFFERENTIAL  ETHANOL  PROTIME-INR  APTT  COMPREHENSIVE METABOLIC PANEL  RAPID URINE DRUG SCREEN, HOSP PERFORMED  URINALYSIS, ROUTINE W REFLEX MICROSCOPIC  CBG MONITORING, ED  I-STAT CHEM 8, ED    CT HEAD CODE STROKE WO CONTRAST  Final Result    MR BRAIN WO CONTRAST    (Results Pending)  MR ANGIO HEAD WO CONTRAST    (Results Pending)  MR Angiogram Neck W or Wo Contrast    (Results Pending)    Medications - No data to display   Procedures  /  Critical Care .Critical Care Performed by: Sabas Sous, MD Authorized by: Sabas Sous, MD   Critical care  provider statement:    Critical care time (minutes):  34   Critical care was necessary to treat or prevent imminent or life-threatening deterioration of the following conditions: Concern for acute ischemic stroke, initiation of code stroke protocol.   Critical care was time spent personally by me on the following activities:  Discussions with consultants, evaluation of patient's response to treatment, examination of patient, ordering and performing treatments and interventions, ordering and review of laboratory studies, ordering and review of radiographic studies, pulse oximetry, re-evaluation of patient's condition, obtaining history from patient or surrogate and review of old charts  ED Course and Medical Decision Making  I have reviewed the triage vital signs, the nursing notes, and pertinent available records from the EMR.  Listed above are laboratory and imaging tests that I personally ordered, reviewed, and interpreted and then considered in my medical decision making (see below for details).  Concern for acute ischemic stroke, onset 1-1/2 hours ago, code stroke initiated.  Also considering migraine or seizure mimics.  Awaiting CT, teleneurology evaluation.  Patient has contrast allergy.     Discussed case with telemetry neurologist and with patient.  We had a group discussion.  I was present during the discussion of risks and benefits of tPA.  Patient is refusing tPA, she is concerned about the risk of bleeding.  She absolutely will not do it.  She is well aware of the consequences of foregoing this medication, including permanent or worsening nature of her stroke.  Patient is agreeable to possible thrombectomy.  However this is complicated by her contrast allergy.  She absolutely refuses any pretreatment for contrast CT imaging, given the history she has with anaphylaxis.  Therefore the only option is to transfer to [facility] for MRI or MRA imaging to see if she has a clot and would be a  candidate for thrombectomy.  Currently does not have any hard signs of large vessel occlusion, NIH of 4, mostly having right arm weakness and dysarthria.  No visual field cuts or aphasia or neglect.  She is aware of the possibility of delay in care given this plan and she is excepting of this.  Case discussed with Dr. Rubin Payor of the [facility] emergency department who accepts patient for transfer.  Dr. Iver Nestle of [facility] neurology also made aware and will follow.  Elmer Sow. Pilar Plate, MD [facility] Emergency Medicine [facility] [email or web address]  Final Clinical Impressions(s) / ED Diagnoses     ICD-10-CM   1. Acute ischemic stroke [facility])  I63.9       ED Discharge Orders     None  Discharge Instructions Discussed with and Provided to Patient:   Discharge Instructions   None       Sabas Sous, MD 03/12/21 2340

## 2021-03-13 ENCOUNTER — Encounter (HOSPITAL_COMMUNITY): Payer: Self-pay | Admitting: Internal Medicine

## 2021-03-13 ENCOUNTER — Emergency Department (HOSPITAL_COMMUNITY): Payer: Medicare Other

## 2021-03-13 ENCOUNTER — Inpatient Hospital Stay (HOSPITAL_COMMUNITY): Payer: Medicare Other

## 2021-03-13 DIAGNOSIS — R471 Dysarthria and anarthria: Secondary | ICD-10-CM | POA: Diagnosis present

## 2021-03-13 DIAGNOSIS — I160 Hypertensive urgency: Secondary | ICD-10-CM | POA: Diagnosis not present

## 2021-03-13 DIAGNOSIS — Z880 Allergy status to penicillin: Secondary | ICD-10-CM | POA: Diagnosis not present

## 2021-03-13 DIAGNOSIS — Z8249 Family history of ischemic heart disease and other diseases of the circulatory system: Secondary | ICD-10-CM | POA: Diagnosis not present

## 2021-03-13 DIAGNOSIS — I639 Cerebral infarction, unspecified: Secondary | ICD-10-CM | POA: Diagnosis present

## 2021-03-13 DIAGNOSIS — I6381 Other cerebral infarction due to occlusion or stenosis of small artery: Secondary | ICD-10-CM | POA: Diagnosis present

## 2021-03-13 DIAGNOSIS — Z20822 Contact with and (suspected) exposure to covid-19: Secondary | ICD-10-CM | POA: Diagnosis present

## 2021-03-13 DIAGNOSIS — Z83438 Family history of other disorder of lipoprotein metabolism and other lipidemia: Secondary | ICD-10-CM | POA: Diagnosis not present

## 2021-03-13 DIAGNOSIS — R29704 NIHSS score 4: Secondary | ICD-10-CM | POA: Diagnosis present

## 2021-03-13 DIAGNOSIS — I6389 Other cerebral infarction: Secondary | ICD-10-CM | POA: Diagnosis not present

## 2021-03-13 DIAGNOSIS — I129 Hypertensive chronic kidney disease with stage 1 through stage 4 chronic kidney disease, or unspecified chronic kidney disease: Secondary | ICD-10-CM | POA: Diagnosis present

## 2021-03-13 DIAGNOSIS — I959 Hypotension, unspecified: Secondary | ICD-10-CM | POA: Diagnosis present

## 2021-03-13 DIAGNOSIS — R29810 Facial weakness: Secondary | ICD-10-CM | POA: Diagnosis present

## 2021-03-13 DIAGNOSIS — G43909 Migraine, unspecified, not intractable, without status migrainosus: Secondary | ICD-10-CM | POA: Diagnosis present

## 2021-03-13 DIAGNOSIS — Z91041 Radiographic dye allergy status: Secondary | ICD-10-CM | POA: Diagnosis not present

## 2021-03-13 DIAGNOSIS — Z5329 Procedure and treatment not carried out because of patient's decision for other reasons: Secondary | ICD-10-CM | POA: Diagnosis present

## 2021-03-13 DIAGNOSIS — I6330 Cerebral infarction due to thrombosis of unspecified cerebral artery: Secondary | ICD-10-CM | POA: Insufficient documentation

## 2021-03-13 DIAGNOSIS — N1831 Chronic kidney disease, stage 3a: Secondary | ICD-10-CM | POA: Diagnosis present

## 2021-03-13 DIAGNOSIS — N183 Chronic kidney disease, stage 3 unspecified: Secondary | ICD-10-CM | POA: Diagnosis present

## 2021-03-13 DIAGNOSIS — Z882 Allergy status to sulfonamides status: Secondary | ICD-10-CM | POA: Diagnosis not present

## 2021-03-13 DIAGNOSIS — Z91018 Allergy to other foods: Secondary | ICD-10-CM | POA: Diagnosis not present

## 2021-03-13 DIAGNOSIS — D649 Anemia, unspecified: Secondary | ICD-10-CM | POA: Diagnosis present

## 2021-03-13 DIAGNOSIS — N1832 Chronic kidney disease, stage 3b: Secondary | ICD-10-CM | POA: Diagnosis present

## 2021-03-13 DIAGNOSIS — Z8673 Personal history of transient ischemic attack (TIA), and cerebral infarction without residual deficits: Secondary | ICD-10-CM | POA: Diagnosis not present

## 2021-03-13 DIAGNOSIS — G8191 Hemiplegia, unspecified affecting right dominant side: Secondary | ICD-10-CM | POA: Diagnosis present

## 2021-03-13 DIAGNOSIS — G40909 Epilepsy, unspecified, not intractable, without status epilepticus: Secondary | ICD-10-CM | POA: Diagnosis present

## 2021-03-13 DIAGNOSIS — D631 Anemia in chronic kidney disease: Secondary | ICD-10-CM | POA: Diagnosis present

## 2021-03-13 DIAGNOSIS — E785 Hyperlipidemia, unspecified: Secondary | ICD-10-CM | POA: Diagnosis present

## 2021-03-13 DIAGNOSIS — Z888 Allergy status to other drugs, medicaments and biological substances status: Secondary | ICD-10-CM | POA: Diagnosis not present

## 2021-03-13 DIAGNOSIS — K219 Gastro-esophageal reflux disease without esophagitis: Secondary | ICD-10-CM | POA: Diagnosis present

## 2021-03-13 LAB — URINALYSIS, ROUTINE W REFLEX MICROSCOPIC
Bacteria, UA: NONE SEEN
Bilirubin Urine: NEGATIVE
Glucose, UA: NEGATIVE mg/dL
Hgb urine dipstick: NEGATIVE
Ketones, ur: NEGATIVE mg/dL
Nitrite: NEGATIVE
Protein, ur: NEGATIVE mg/dL
Specific Gravity, Urine: 1.009 (ref 1.005–1.030)
pH: 8 (ref 5.0–8.0)

## 2021-03-13 LAB — ECHOCARDIOGRAM COMPLETE
Area-P 1/2: 2.69 cm2
Calc EF: 69.5 %
Height: 66 in
P 1/2 time: 573 msec
S' Lateral: 2.2 cm
Single Plane A2C EF: 62.9 %
Single Plane A4C EF: 72.1 %
Weight: 2358.4 oz

## 2021-03-13 LAB — CBC
HCT: 34.9 % — ABNORMAL LOW (ref 36.0–46.0)
Hemoglobin: 11.4 g/dL — ABNORMAL LOW (ref 12.0–15.0)
MCH: 28.6 pg (ref 26.0–34.0)
MCHC: 32.7 g/dL (ref 30.0–36.0)
MCV: 87.7 fL (ref 80.0–100.0)
Platelets: 298 10*3/uL (ref 150–400)
RBC: 3.98 MIL/uL (ref 3.87–5.11)
RDW: 12.9 % (ref 11.5–15.5)
WBC: 7.1 10*3/uL (ref 4.0–10.5)
nRBC: 0 % (ref 0.0–0.2)

## 2021-03-13 LAB — RAPID URINE DRUG SCREEN, HOSP PERFORMED
Amphetamines: NOT DETECTED
Barbiturates: NOT DETECTED
Benzodiazepines: NOT DETECTED
Cocaine: NOT DETECTED
Opiates: NOT DETECTED
Tetrahydrocannabinol: NOT DETECTED

## 2021-03-13 LAB — LIPID PANEL
Cholesterol: 346 mg/dL — ABNORMAL HIGH (ref 0–200)
HDL: 52 mg/dL (ref >40)
LDL Cholesterol: 253 mg/dL — ABNORMAL HIGH (ref 0–99)
Total CHOL/HDL Ratio: 6.7 RATIO
Triglycerides: 207 mg/dL — ABNORMAL HIGH (ref <150)
VLDL: 41 mg/dL — ABNORMAL HIGH (ref 0–40)

## 2021-03-13 LAB — CREATININE, SERUM
Creatinine, Ser: 1.64 mg/dL — ABNORMAL HIGH (ref 0.44–1.00)
GFR, Estimated: 33 mL/min — ABNORMAL LOW (ref >60)

## 2021-03-13 LAB — HEMOGLOBIN A1C
Hgb A1c MFr Bld: 5.2 % (ref 4.8–5.6)
Mean Plasma Glucose: 102.54 mg/dL

## 2021-03-13 LAB — RESP PANEL BY RT-PCR (FLU A&B, COVID) ARPGX2
Influenza A by PCR: NEGATIVE
Influenza B by PCR: NEGATIVE
SARS Coronavirus 2 by RT PCR: NEGATIVE

## 2021-03-13 IMAGING — MR MR MRA HEAD W/O CM
1 series · 19 of 48 positions shown · non-contrast
Comparison: None.

CLINICAL DATA: Dizziness.  Right arm weakness.



[Series 5: 3d cow · axial · 0.5mm · 0.41mm/px · z∈[-49,+32]mm · 19 of 172 slices shown]
[im 1/172]
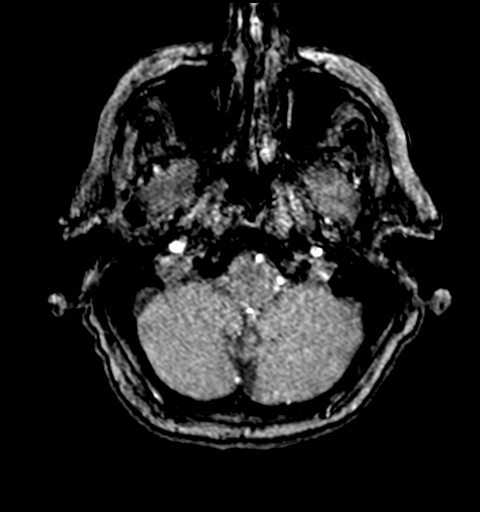
[im 4/172]
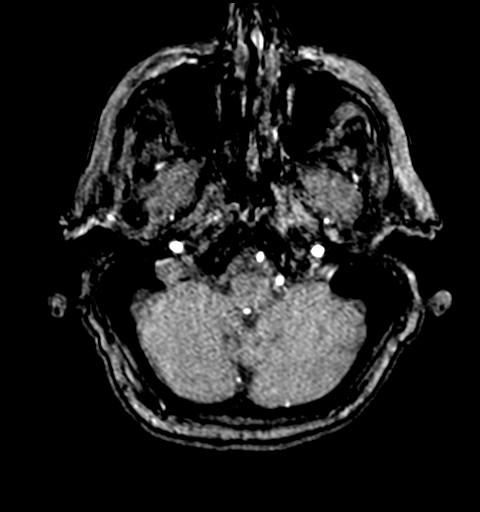
[im 8/172]
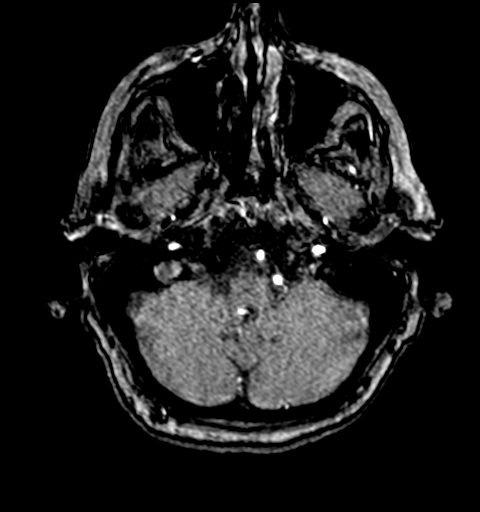
[im 11/172]
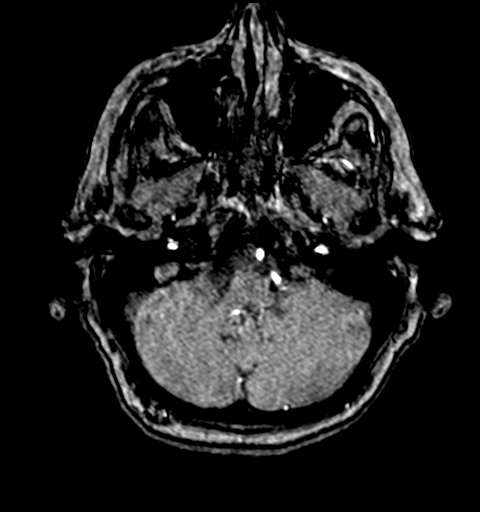
[im 15/172]
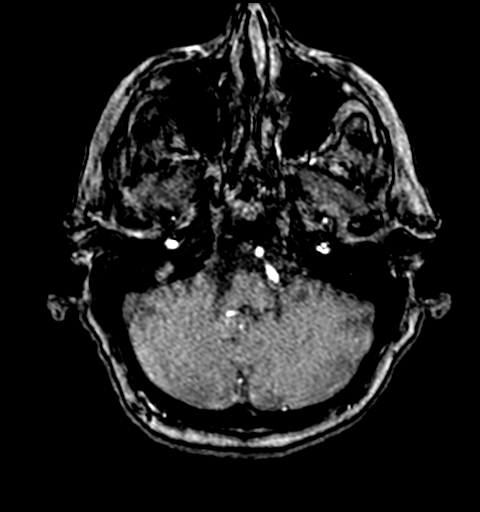
[im 19/172]
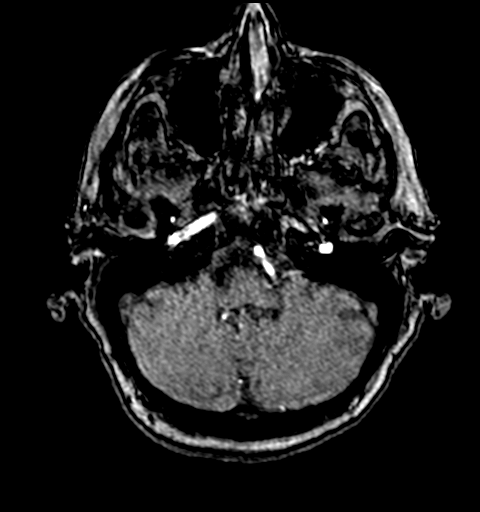
[im 22/172]
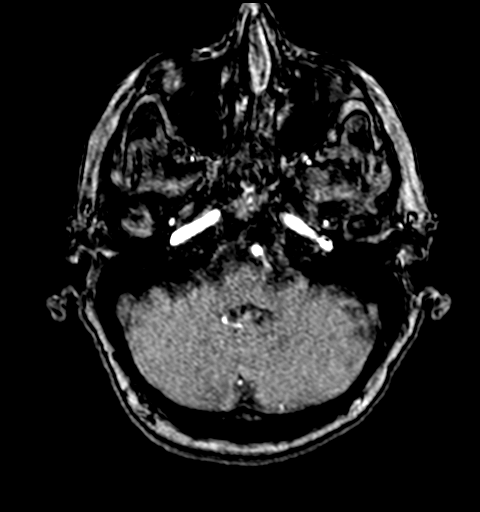
[im 26/172]
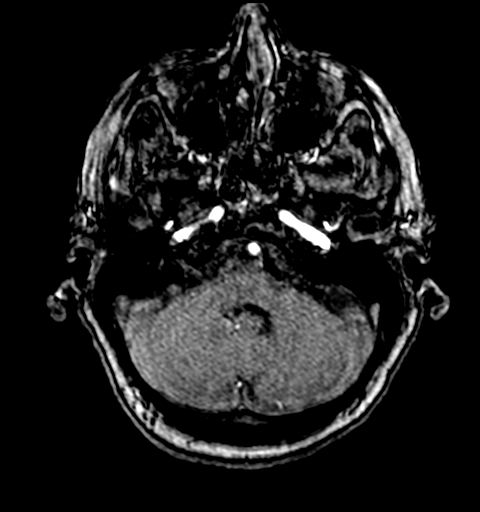
[im 30/172]
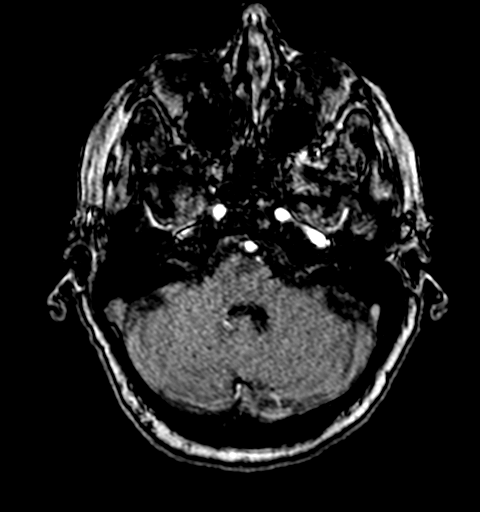
[im 33/172]
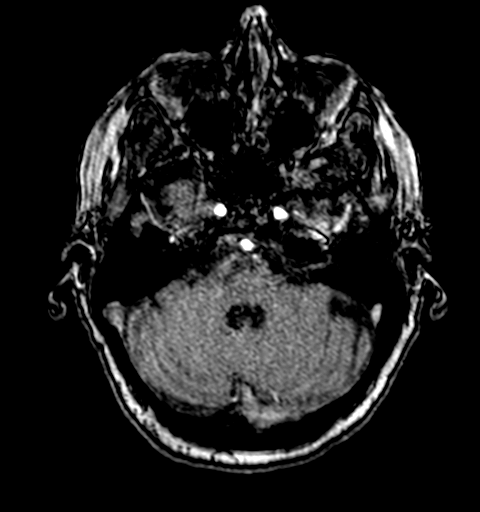
[im 37/172]
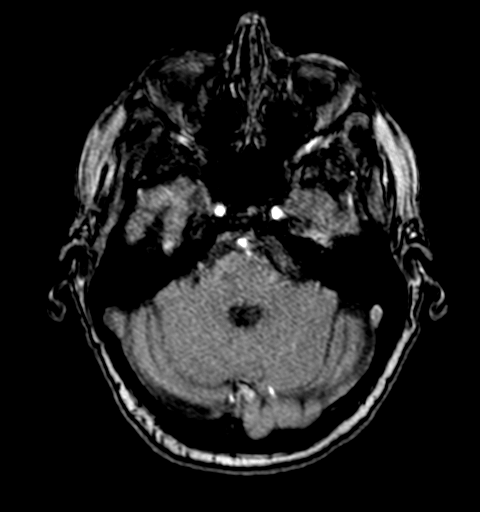
[im 55/172]
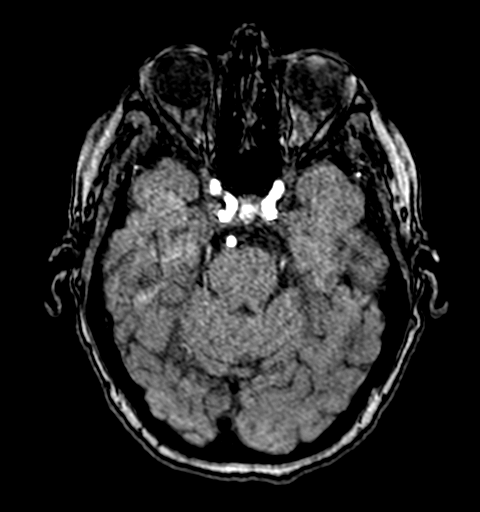
[im 77/172]
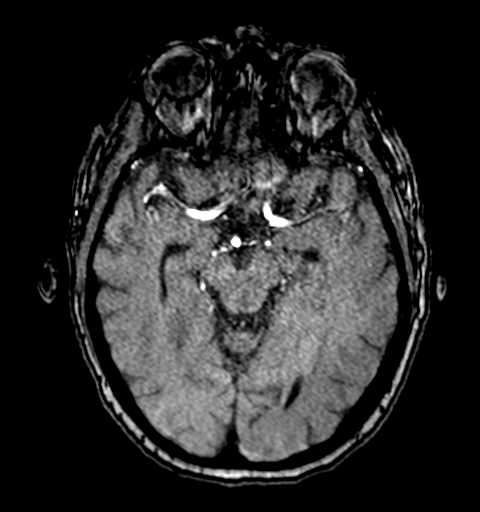
[im 88/172]
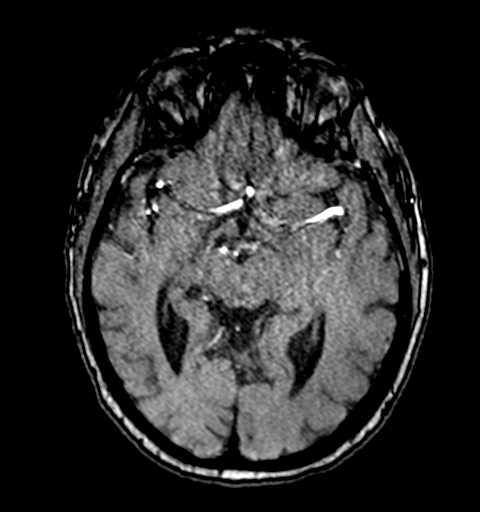
[im 99/172]
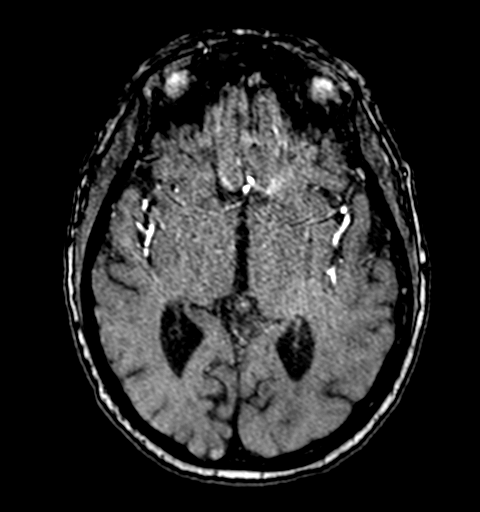
[im 121/172]
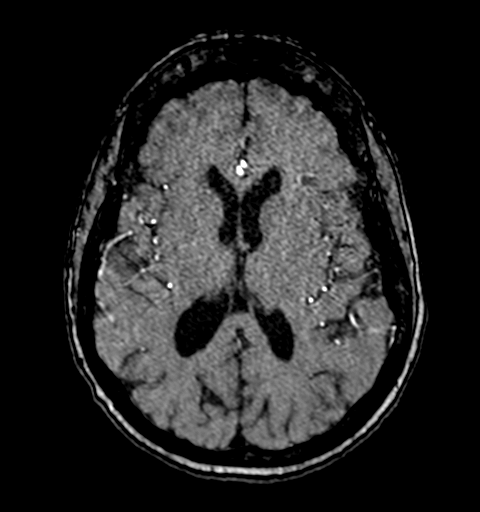
[im 142/172]
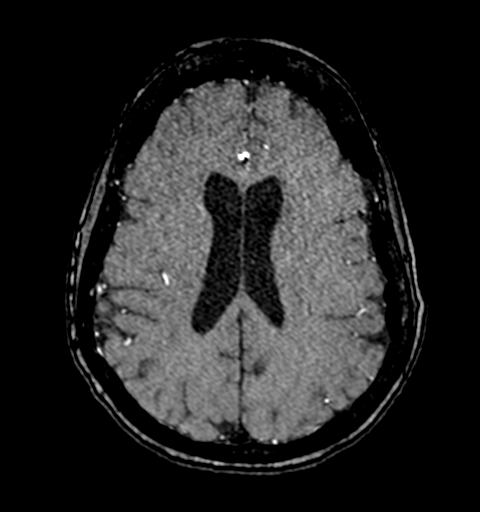
[im 146/172]
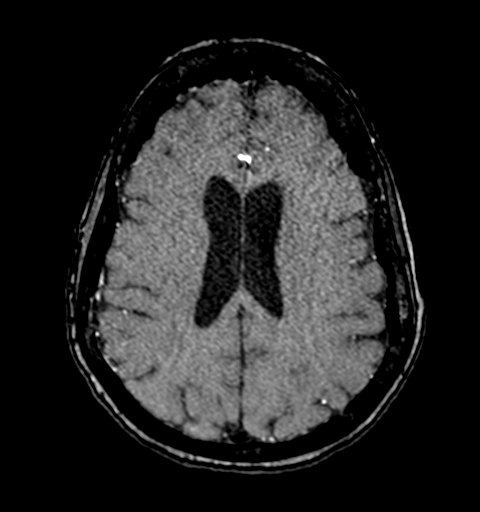
[im 164/172]
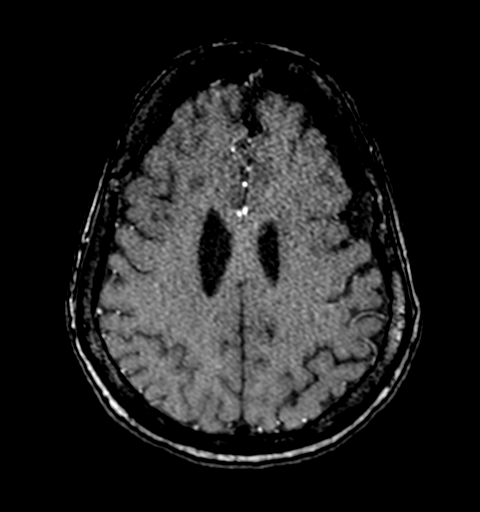

[19 of 48 positions shown; findings below may reference images not displayed]

FINDINGS: MRI HEAD FINDINGS

Brain: Small focus of abnormal diffusion restriction of the ventral
left pons. No other diffusion abnormality. No acute or chronic
hemorrhage. There is multifocal hyperintense T2-weighted signal
within the white matter. Generalized volume loss without a clear
lobar predilection. The midline structures are normal.

Vascular: Major flow voids are preserved.

Skull and upper cervical spine: Normal calvarium and skull base.
Visualized upper cervical spine and soft tissues are normal.

Sinuses/Orbits:No paranasal sinus fluid levels or advanced mucosal
thickening. No mastoid or middle ear effusion. Normal orbits.

MRA HEAD FINDINGS

POSTERIOR CIRCULATION:

--Vertebral arteries: Normal

--Inferior cerebellar arteries: Normal.

--Basilar artery: Normal.

--Superior cerebellar arteries: Normal.

--Posterior cerebral arteries: Short segment occlusion of the right
P1 segment. Distal right PCA is patent. Severe stenosis of the
proximal left P2 segment.

ANTERIOR CIRCULATION:

--Intracranial internal carotid arteries: Normal.

--Anterior cerebral arteries (ACA): Normal.

--Middle cerebral arteries (MCA): Moderate stenosis of the proximal
right M2 segment. Bilateral atherosclerotic irregularity, possibly
exaggerated by motion.

ANATOMIC VARIANTS: None

MRA NECK FINDINGS

The MRA of the neck was not completed due to patient claustrophobia
and inability to tolerate the remainder of the examination. The
obtained images are severely motion degraded.
IMPRESSION: 1. Small focus of acute ischemia of the ventral left pons. No
hemorrhage or mass effect.
2. Short segment occlusion of the right PCA P1 segment. Severe
stenosis of the proximal left P2 segment.
3. Moderate stenosis of the proximal right MCA M2 segment.
4. Severely motion degraded and truncated examination of the neck.

## 2021-03-13 IMAGING — MR MR MRA NECK W/O CM
2 series · 48 of 48 positions shown · non-contrast
Comparison: None.

CLINICAL DATA: Dizziness.  Right arm weakness.



[Series 4: vessel_scout_neck · sagittal · 5.0mm · 1.37mm/px · 24 of 26 slices shown]
[im 1/26]
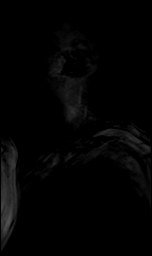
[im 2/26]
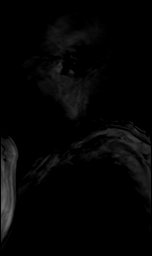
[im 3/26]
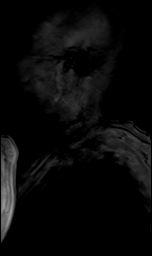
[im 4/26]
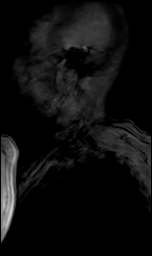
[im 5/26]
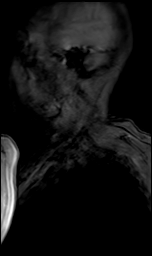
[im 6/26]
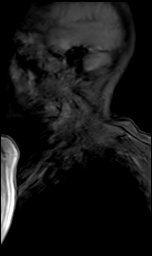
[im 7/26]
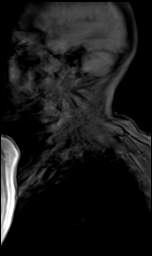
[im 8/26]
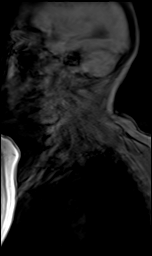
[im 9/26]
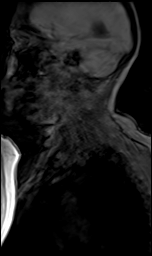
[im 10/26]
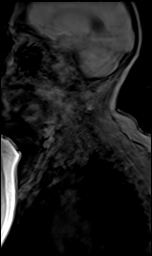
[im 11/26]
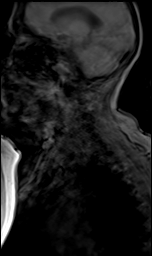
[im 12/26]
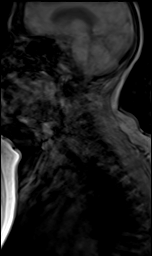
[im 14/26]
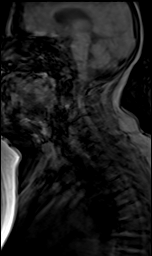
[im 15/26]
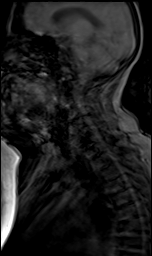
[im 16/26]
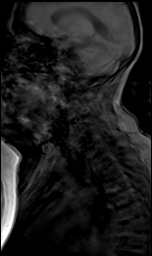
[im 17/26]
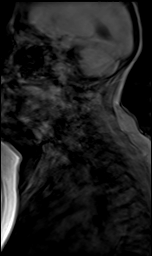
[im 18/26]
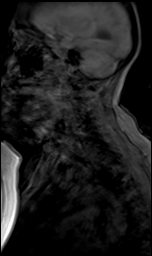
[im 19/26]
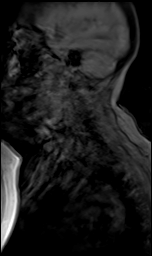
[im 20/26]
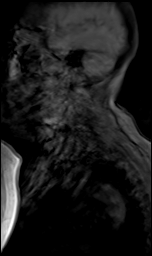
[im 21/26]
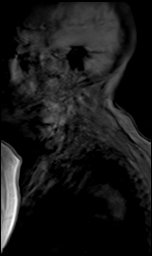
[im 22/26]
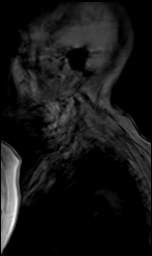
[im 23/26]
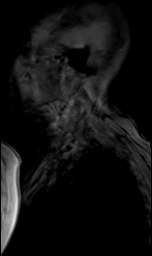
[im 24/26]
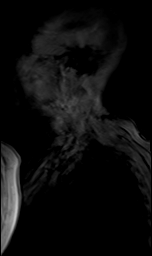
[im 26/26]
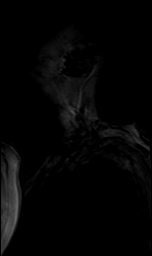

[Series 5: vessel_scout_neck_msum · sagittal · 5.0mm · 1.37mm/px · 24 of 26 slices shown]
[im 1/26]
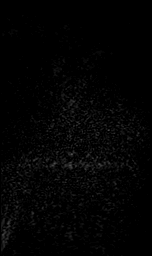
[im 2/26]
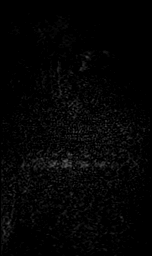
[im 3/26]
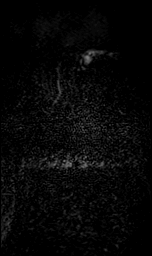
[im 4/26]
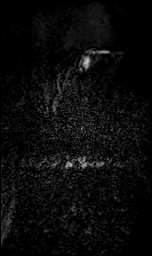
[im 5/26]
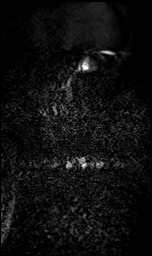
[im 6/26]
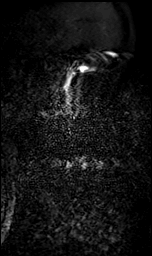
[im 7/26]
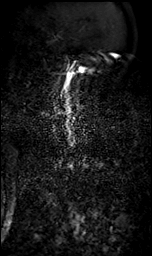
[im 8/26]
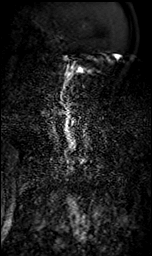
[im 9/26]
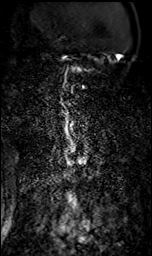
[im 10/26]
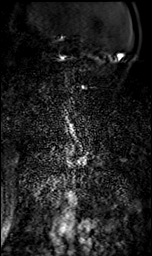
[im 11/26]
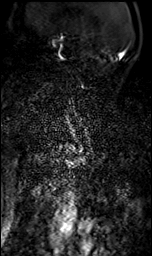
[im 12/26]
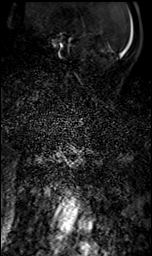
[im 14/26]
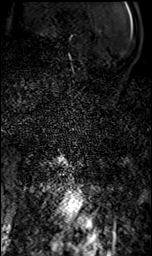
[im 15/26]
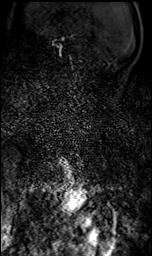
[im 16/26]
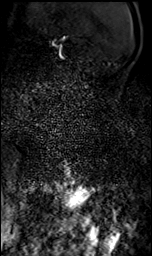
[im 17/26]
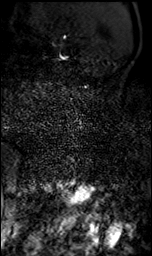
[im 18/26]
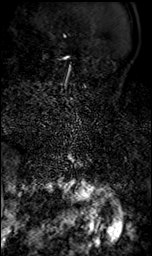
[im 19/26]
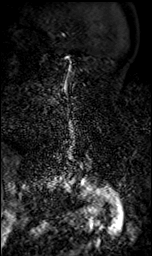
[im 20/26]
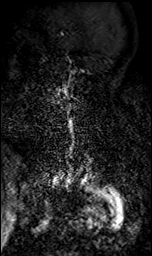
[im 21/26]
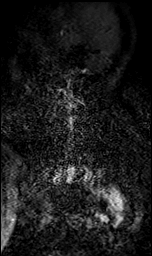
[im 22/26]
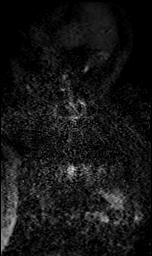
[im 23/26]
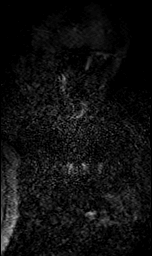
[im 24/26]
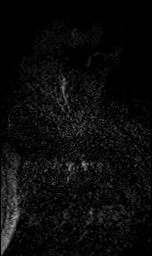
[im 26/26]
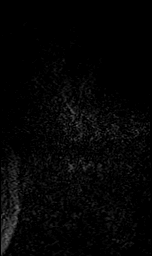

[48 of 48 positions shown; findings below may reference images not displayed]

FINDINGS: MRI HEAD FINDINGS

Brain: Small focus of abnormal diffusion restriction of the ventral
left pons. No other diffusion abnormality. No acute or chronic
hemorrhage. There is multifocal hyperintense T2-weighted signal
within the white matter. Generalized volume loss without a clear
lobar predilection. The midline structures are normal.

Vascular: Major flow voids are preserved.

Skull and upper cervical spine: Normal calvarium and skull base.
Visualized upper cervical spine and soft tissues are normal.

Sinuses/Orbits:No paranasal sinus fluid levels or advanced mucosal
thickening. No mastoid or middle ear effusion. Normal orbits.

MRA HEAD FINDINGS

POSTERIOR CIRCULATION:

--Vertebral arteries: Normal

--Inferior cerebellar arteries: Normal.

--Basilar artery: Normal.

--Superior cerebellar arteries: Normal.

--Posterior cerebral arteries: Short segment occlusion of the right
P1 segment. Distal right PCA is patent. Severe stenosis of the
proximal left P2 segment.

ANTERIOR CIRCULATION:

--Intracranial internal carotid arteries: Normal.

--Anterior cerebral arteries (ACA): Normal.

--Middle cerebral arteries (MCA): Moderate stenosis of the proximal
right M2 segment. Bilateral atherosclerotic irregularity, possibly
exaggerated by motion.

ANATOMIC VARIANTS: None

MRA NECK FINDINGS

The MRA of the neck was not completed due to patient claustrophobia
and inability to tolerate the remainder of the examination. The
obtained images are severely motion degraded.
IMPRESSION: 1. Small focus of acute ischemia of the ventral left pons. No
hemorrhage or mass effect.
2. Short segment occlusion of the right PCA P1 segment. Severe
stenosis of the proximal left P2 segment.
3. Moderate stenosis of the proximal right MCA M2 segment.
4. Severely motion degraded and truncated examination of the neck.

## 2021-03-13 MED ORDER — ASPIRIN EC 81 MG PO TBEC: 81.0000 mg | DELAYED_RELEASE_TABLET | Freq: Every day | ORAL | Status: DC

## 2021-03-13 MED ORDER — HYDRALAZINE HCL 20 MG/ML IJ SOLN: 10.0000 mg | INTRAMUSCULAR | Status: DC | PRN

## 2021-03-13 MED ORDER — ENOXAPARIN SODIUM 30 MG/0.3ML IJ SOSY
30.0000 mg | PREFILLED_SYRINGE | Freq: Every day | INTRAMUSCULAR | Status: DC
  Administered 2021-03-13: 30 mg via SUBCUTANEOUS
  Filled 2021-03-13: qty 0.3

## 2021-03-13 MED ORDER — PANTOPRAZOLE SODIUM 40 MG PO TBEC
40.0000 mg | DELAYED_RELEASE_TABLET | Freq: Every day | ORAL | Status: DC
  Administered 2021-03-13: 40 mg via ORAL
  Filled 2021-03-13: qty 1

## 2021-03-13 MED ORDER — CLOPIDOGREL BISULFATE 75 MG PO TABS: 75.0000 mg | ORAL_TABLET | Freq: Every day | ORAL | Status: DC

## 2021-03-13 MED ORDER — ACETAMINOPHEN 325 MG PO TABS: 650.0000 mg | ORAL_TABLET | Freq: Four times a day (QID) | ORAL | Status: DC | PRN

## 2021-03-13 MED ORDER — ASPIRIN 325 MG PO TABS
325.0000 mg | ORAL_TABLET | Freq: Once | ORAL | Status: AC
  Administered 2021-03-13: 325 mg via ORAL
  Filled 2021-03-13: qty 1

## 2021-03-13 MED ORDER — STROKE: EARLY STAGES OF RECOVERY BOOK: Freq: Once | Status: DC

## 2021-03-13 MED ORDER — CLOPIDOGREL BISULFATE 300 MG PO TABS
300.0000 mg | ORAL_TABLET | Freq: Once | ORAL | Status: AC
  Administered 2021-03-13: 300 mg via ORAL
  Filled 2021-03-13: qty 1

## 2021-03-13 MED ORDER — IBUPROFEN 400 MG PO TABS
600.0000 mg | ORAL_TABLET | Freq: Three times a day (TID) | ORAL | Status: DC | PRN
  Administered 2021-03-13: 600 mg via ORAL
  Filled 2021-03-13: qty 1

## 2021-03-13 MED ORDER — LORAZEPAM 2 MG/ML IJ SOLN: 1.0000 mg | Freq: Once | INTRAMUSCULAR | Status: AC

## 2021-03-13 MED ORDER — ACETAMINOPHEN 650 MG RE SUPP: 650.0000 mg | RECTAL | Status: DC | PRN

## 2021-03-13 MED ORDER — LAMOTRIGINE 100 MG PO TABS
100.0000 mg | ORAL_TABLET | Freq: Every day | ORAL | Status: DC
  Filled 2021-03-13: qty 1

## 2021-03-13 MED ORDER — STROKE: EARLY STAGES OF RECOVERY BOOK
Freq: Once | Status: AC
  Filled 2021-03-13: qty 1

## 2021-03-13 MED ORDER — ACETAMINOPHEN 325 MG PO TABS
650.0000 mg | ORAL_TABLET | ORAL | Status: DC | PRN
  Administered 2021-03-13: 650 mg via ORAL
  Filled 2021-03-13: qty 2

## 2021-03-13 MED ORDER — ATORVASTATIN CALCIUM 40 MG PO TABS
80.0000 mg | ORAL_TABLET | Freq: Every day | ORAL | Status: DC
  Administered 2021-03-13: 80 mg via ORAL
  Filled 2021-03-13: qty 2

## 2021-03-13 MED ORDER — LABETALOL HCL 5 MG/ML IV SOLN
20.0000 mg | Freq: Once | INTRAVENOUS | Status: AC
  Administered 2021-03-13: 20 mg via INTRAVENOUS
  Filled 2021-03-13: qty 4

## 2021-03-13 MED ORDER — LORAZEPAM 2 MG/ML IJ SOLN
INTRAMUSCULAR | Status: AC
  Administered 2021-03-13: 1 mg via INTRAVENOUS
  Filled 2021-03-13: qty 1

## 2021-03-13 MED ORDER — ACETAMINOPHEN 160 MG/5ML PO SOLN: 650.0000 mg | ORAL | Status: DC | PRN

## 2021-03-13 NOTE — Progress Notes (Signed)
  Echocardiogram 2D Echocardiogram has been performed.  Cassandra Hayden 03/13/2021, 11:57 AM

## 2021-03-13 NOTE — ED Notes (Signed)
Dr. Kakrakandy at bedside. 

## 2021-03-13 NOTE — Progress Notes (Addendum)
PROGRESS NOTE                                                                                                                                                                                                             Patient Demographics:    Cassandra Hayden, is a 70 y.o. female, DOB - 1951-02-17, QJJ:[id number]  Outpatient Primary MD for the patient is Margot Ables, MD    LOS - 0  Admit date - 03/12/2021    Chief Complaint  Patient presents with   Code Stroke       Brief Narrative (HPI from H&P)  -  Cassandra Hayden is a 70 y.o. female with history of hyperlipidemia unmedicated, TIA, anxiety, iodinated contrast allergy and seizures at around 8:30 PM last night started experiencing dizziness and when she checked her blood pressure was around 70 systolic.  Patient around 9 PM got up to drink some fluid when she noticed that she was weak on the right side.  Patient was brought to the ER.  Patient states she also had some slurred speech, she was diagnosed with CVA and admitted.   Subjective:    Cassandra Hayden today has, No headache, No chest pain, No abdominal pain - No Nausea, No new weakness tingling or numbness, improved R. Sided weakness   Assessment  & Plan :      Acute left pons ischemic infarct - she currently has been placed on dual antiplatelet therapy along with high intensity statin, stroke pathway underway, she has history of dyslipidemia and was not on medication as well.Will defer CVA management to neurology.  Allow for permissive hypertension.  Lab Results  Component Value Date   HGBA1C 5.2 03/13/2021   Lab Results  Component Value Date   CHOL 346 (H) 03/13/2021   HDL 52 03/13/2021   LDLCALC 253 (H) 03/13/2021   TRIG 207 (H) 03/13/2021   CHOLHDL 6.7 03/13/2021    2.  HX of seizures.  Continue Lamictal.  3.  Dyslipidemia.  Placed on high intensity statin.  4.  GERD.  On PPI.  5.  Chronic anemia.   Ahe appropriate outpatient work-up.  6. 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        Condition - Fair  Family Communication  :  None present  Code Status :  Full  Consults  :  Neuro  PUD Prophylaxis : PPI   Procedures  :     MRI MRA Brain, MRA Neck -  1. Small focus of acute ischemia of the ventral left pons. No hemorrhage or mass effect. 2. Short segment occlusion of the right PCA P1 segment. Severe stenosis of the proximal left P2 segment. 3. Moderate stenosis of the proximal right MCA M2 segment. 4. Severely motion degraded and truncated examination of the neck.   TTE      Disposition Plan  :    Status is: Inpatient  Remains inpatient appropriate because:Inpatient level of care appropriate due to severity of illness  Dispo: The patient is from: Home              Anticipated d/c is to: Home              Patient currently is not medically stable to d/c.   Difficult to place patient No  DVT Prophylaxis  :    enoxaparin (LOVENOX) injection 30 mg Start: 03/13/21 1000    Lab Results  Component Value Date   PLT 298 03/13/2021    Diet :  Diet Order             Diet NPO time specified Except for: Sips with Meds  Diet effective now                    Inpatient Medications  Scheduled Meds:   stroke: mapping our early stages of recovery book   Does not apply Once   [START ON 03/14/2021] aspirin EC  81 mg Oral Daily   atorvastatin  80 mg Oral Daily   [START ON 03/14/2021] clopidogrel  75 mg Oral Daily   enoxaparin (LOVENOX) injection  30 mg Subcutaneous Daily   labetalol  20 mg Intravenous Once   lamoTRIgine  100 mg Oral Daily   pantoprazole  40 mg Oral Daily   Continuous Infusions: PRN Meds:.acetaminophen, hydrALAZINE, ibuprofen  Antibiotics  :    Anti-infectives (From admission, onward)    None        Time Spent in minutes  30   Susa Raring M.D on 03/13/2021 at 12:11 PM  To page go to [email or web address]   [facility] -  Office  [phone]    See all Orders from today for further details    Objective:   Vitals:   03/13/21 0802 03/13/21 0815 03/13/21 0830 03/13/21 1030  BP: (!) 197/92 (!) 199/87 (!) 187/59 (!) 221/86  Pulse: 63 71 (!) 54 73  Resp: (!) 21 (!) 25 10 18   Temp: 98.6 F (37 C)     TempSrc: Oral     SpO2: 100% 100% 96% 100%  Weight:      Height:        Wt Readings from Last 3 Encounters:  03/12/21 66.9 kg  06/23/19 56.2 kg  05/31/19 63.4 kg    No intake or output data in the 24 hours ending 03/13/21 1211   Physical Exam  Awake Alert, No new F.N deficits, ? Mild R sided weakness [place].AT,PERRAL Supple Neck,No JVD, No cervical lymphadenopathy appriciated.  Symmetrical Chest wall movement, Good air movement bilaterally, CTAB RRR,No Gallops,Rubs or new Murmurs, No Parasternal Heave +ve B.Sounds, Abd Soft, No tenderness, No organomegaly appriciated, No rebound - guarding or rigidity. No Cyanosis, Clubbing or edema, No new Rash or bruise      Data Review:    CBC Recent Labs  Lab 03/12/21 2311 03/13/21 0443  WBC 8.0 7.1  HGB 10.1* 11.4*  HCT 32.0* 34.9*  PLT 264 298  MCV 90.4 87.7  MCH 28.5 28.6  MCHC 31.6 32.7  RDW 13.0 12.9  LYMPHSABS 2.9  --   MONOABS 0.7  --   EOSABS 0.4  --   BASOSABS 0.1  --     Recent Labs  Lab 03/12/21 2311 03/13/21 0443  NA 139  --   K 3.9  --   CL 108  --   CO2 24  --   GLUCOSE 96  --   BUN 25*  --   CREATININE 1.66* 1.64*  CALCIUM 8.6*  --   AST 17  --   ALT 11  --   ALKPHOS 65  --   BILITOT 0.5  --   ALBUMIN 4.0  --   INR 1.0  --   HGBA1C  --  5.2    ------------------------------------------------------------------------------------------------------------------ Recent Labs    03/13/21 0443  CHOL 346*  HDL 52  LDLCALC 253*  TRIG 207*  CHOLHDL 6.7    Lab Results  Component Value Date   HGBA1C 5.2 03/13/2021    ------------------------------------------------------------------------------------------------------------------ No results for input(s): TSH, T4TOTAL, T3FREE, THYROIDAB in the last 72 hours.  Invalid input(s): FREET3  Cardiac Enzymes No results for input(s): CKMB, TROPONINI, MYOGLOBIN in the last 168 hours.  Invalid input(s): CK ------------------------------------------------------------------------------------------------------------------ No results found for: BNP   Radiology Reports MR ANGIO HEAD WO CONTRAST  Result Date: 03/13/2021 CLINICAL DATA:  Dizziness.  Right arm weakness. EXAM: MRI HEAD WITHOUT CONTRAST MRA HEAD WITHOUT CONTRAST MRA NECK WITHOUT CONTRAST TECHNIQUE: Multiplanar, multiecho pulse sequences of the brain and surrounding structures were obtained without intravenous contrast. Angiographic images of the Circle of Willis were obtained using MRA technique without intravenous contrast. Angiographic images of the neck were obtained using MRA technique without intravenous contrast. Carotid stenosis measurements (when applicable) are obtained utilizing NASCET criteria, using the distal internal carotid diameter as the denominator. COMPARISON:  None. FINDINGS: MRI HEAD FINDINGS Brain: Small focus of abnormal diffusion restriction of the ventral left pons. No other diffusion abnormality. No acute or chronic hemorrhage. There is multifocal hyperintense T2-weighted signal within the white matter. Generalized volume loss without a clear lobar predilection. The midline structures are normal. Vascular: Major flow voids are preserved. Skull and upper cervical spine: Normal calvarium and skull base. Visualized upper cervical spine and soft tissues are normal. Sinuses/Orbits:No paranasal sinus fluid levels or advanced mucosal thickening. No mastoid or middle ear effusion. Normal orbits. MRA HEAD FINDINGS POSTERIOR CIRCULATION: --Vertebral arteries: Normal --Inferior cerebellar arteries:  Normal. --Basilar artery: Normal. --Superior cerebellar arteries: Normal. --Posterior cerebral arteries: Short segment occlusion of the right P1 segment. Distal right PCA is patent. Severe stenosis of the proximal left P2 segment. ANTERIOR CIRCULATION: --Intracranial internal carotid arteries: Normal. --Anterior cerebral arteries (ACA): Normal. --Middle cerebral arteries (MCA): Moderate stenosis of the proximal right M2 segment. Bilateral atherosclerotic irregularity, possibly exaggerated by motion. ANATOMIC VARIANTS: None MRA NECK FINDINGS The MRA of the neck was not completed due to patient claustrophobia and inability to tolerate the remainder of the examination. The obtained images are severely motion degraded. IMPRESSION: 1. Small focus of acute ischemia of the ventral left pons. No hemorrhage or mass effect. 2. Short segment occlusion of the right PCA P1 segment. Severe stenosis of the proximal left P2 segment. 3. Moderate stenosis of the proximal right MCA M2 segment. 4. Severely motion degraded and truncated examination of the neck.  Electronically Signed   By: Deatra Robinson M.D.   On: 03/13/2021 02:41   MR ANGIO NECK WO CONTRAST  Result Date: 03/13/2021 CLINICAL DATA:  Dizziness.  Right arm weakness. EXAM: MRI HEAD WITHOUT CONTRAST MRA HEAD WITHOUT CONTRAST MRA NECK WITHOUT CONTRAST TECHNIQUE: Multiplanar, multiecho pulse sequences of the brain and surrounding structures were obtained without intravenous contrast. Angiographic images of the Circle of Willis were obtained using MRA technique without intravenous contrast. Angiographic images of the neck were obtained using MRA technique without intravenous contrast. Carotid stenosis measurements (when applicable) are obtained utilizing NASCET criteria, using the distal internal carotid diameter as the denominator. COMPARISON:  None. FINDINGS: MRI HEAD FINDINGS Brain: Small focus of abnormal diffusion restriction of the ventral left pons. No other diffusion  abnormality. No acute or chronic hemorrhage. There is multifocal hyperintense T2-weighted signal within the white matter. Generalized volume loss without a clear lobar predilection. The midline structures are normal. Vascular: Major flow voids are preserved. Skull and upper cervical spine: Normal calvarium and skull base. Visualized upper cervical spine and soft tissues are normal. Sinuses/Orbits:No paranasal sinus fluid levels or advanced mucosal thickening. No mastoid or middle ear effusion. Normal orbits. MRA HEAD FINDINGS POSTERIOR CIRCULATION: --Vertebral arteries: Normal --Inferior cerebellar arteries: Normal. --Basilar artery: Normal. --Superior cerebellar arteries: Normal. --Posterior cerebral arteries: Short segment occlusion of the right P1 segment. Distal right PCA is patent. Severe stenosis of the proximal left P2 segment. ANTERIOR CIRCULATION: --Intracranial internal carotid arteries: Normal. --Anterior cerebral arteries (ACA): Normal. --Middle cerebral arteries (MCA): Moderate stenosis of the proximal right M2 segment. Bilateral atherosclerotic irregularity, possibly exaggerated by motion. ANATOMIC VARIANTS: None MRA NECK FINDINGS The MRA of the neck was not completed due to patient claustrophobia and inability to tolerate the remainder of the examination. The obtained images are severely motion degraded. IMPRESSION: 1. Small focus of acute ischemia of the ventral left pons. No hemorrhage or mass effect. 2. Short segment occlusion of the right PCA P1 segment. Severe stenosis of the proximal left P2 segment. 3. Moderate stenosis of the proximal right MCA M2 segment. 4. Severely motion degraded and truncated examination of the neck. Electronically Signed   By: Deatra Robinson M.D.   On: 03/13/2021 02:41   MR BRAIN WO CONTRAST  Result Date: 03/13/2021 CLINICAL DATA:  Dizziness.  Right arm weakness. EXAM: MRI HEAD WITHOUT CONTRAST MRA HEAD WITHOUT CONTRAST MRA NECK WITHOUT CONTRAST TECHNIQUE:  Multiplanar, multiecho pulse sequences of the brain and surrounding structures were obtained without intravenous contrast. Angiographic images of the Circle of Willis were obtained using MRA technique without intravenous contrast. Angiographic images of the neck were obtained using MRA technique without intravenous contrast. Carotid stenosis measurements (when applicable) are obtained utilizing NASCET criteria, using the distal internal carotid diameter as the denominator. COMPARISON:  None. FINDINGS: MRI HEAD FINDINGS Brain: Small focus of abnormal diffusion restriction of the ventral left pons. No other diffusion abnormality. No acute or chronic hemorrhage. There is multifocal hyperintense T2-weighted signal within the white matter. Generalized volume loss without a clear lobar predilection. The midline structures are normal. Vascular: Major flow voids are preserved. Skull and upper cervical spine: Normal calvarium and skull base. Visualized upper cervical spine and soft tissues are normal. Sinuses/Orbits:No paranasal sinus fluid levels or advanced mucosal thickening. No mastoid or middle ear effusion. Normal orbits. MRA HEAD FINDINGS POSTERIOR CIRCULATION: --Vertebral arteries: Normal --Inferior cerebellar arteries: Normal. --Basilar artery: Normal. --Superior cerebellar arteries: Normal. --Posterior cerebral arteries: Short segment occlusion of the right P1 segment. Distal right  PCA is patent. Severe stenosis of the proximal left P2 segment. ANTERIOR CIRCULATION: --Intracranial internal carotid arteries: Normal. --Anterior cerebral arteries (ACA): Normal. --Middle cerebral arteries (MCA): Moderate stenosis of the proximal right M2 segment. Bilateral atherosclerotic irregularity, possibly exaggerated by motion. ANATOMIC VARIANTS: None MRA NECK FINDINGS The MRA of the neck was not completed due to patient claustrophobia and inability to tolerate the remainder of the examination. The obtained images are severely  motion degraded. IMPRESSION: 1. Small focus of acute ischemia of the ventral left pons. No hemorrhage or mass effect. 2. Short segment occlusion of the right PCA P1 segment. Severe stenosis of the proximal left P2 segment. 3. Moderate stenosis of the proximal right MCA M2 segment. 4. Severely motion degraded and truncated examination of the neck. Electronically Signed   By: Deatra Robinson M.D.   On: 03/13/2021 02:41   CT HEAD CODE STROKE WO CONTRAST  Result Date: 03/12/2021 CLINICAL DATA:  Code stroke.  Sudden onset weakness EXAM: CT HEAD WITHOUT CONTRAST TECHNIQUE: Contiguous axial images were obtained from the base of the skull through the vertex without intravenous contrast. COMPARISON:  None. FINDINGS: Brain: There is no mass, hemorrhage or extra-axial collection. The size and configuration of the ventricles and extra-axial CSF spaces are normal. There is hypoattenuation of the periventricular white matter, most commonly indicating chronic ischemic microangiopathy. Vascular: No abnormal hyperdensity of the major intracranial arteries or dural venous sinuses. No intracranial atherosclerosis. Skull: The visualized skull base, calvarium and extracranial soft tissues are normal. Sinuses/Orbits: No fluid levels or advanced mucosal thickening of the visualized paranasal sinuses. No mastoid or middle ear effusion. The orbits are normal. ASPECTS [facility] Stroke Program Early CT Score) - Ganglionic level infarction (caudate, lentiform nuclei, internal capsule, insula, M1-M3 cortex): 7 - Supraganglionic infarction (M4-M6 cortex): 3 Total score (0-10 with 10 being normal): 10 IMPRESSION: 1. Chronic ischemic microangiopathy without acute intracranial abnormality. 2. ASPECTS is 10. These results were called by telephone at the time of interpretation on 03/12/2021 at 11:16 pm to provider [facility] , who verbally acknowledged these results. Electronically Signed   By: Deatra Robinson M.D.   On: 03/12/2021 23:14

## 2021-03-13 NOTE — Progress Notes (Signed)
Carotid duplex has been completed.  Results can be found under chart review under CV PROC. 03/13/2021 12:58 PM Nimrit Kehres RVT, RDMS

## 2021-03-13 NOTE — ED Notes (Signed)
Pt friend Mora Bellman would like updates as needed/available

## 2021-03-13 NOTE — ED Notes (Signed)
Lunch tray ordered at 12:34

## 2021-03-13 NOTE — ED Notes (Signed)
Patient attempted to leave ER, but was stopped by workers at the front desk. Patient states "All I wanted to do was get a [employer] account planner and call my son." Patient was escorted back to her room by front desk staff. She is compliant on staying for now.

## 2021-03-13 NOTE — ED Notes (Addendum)
Pt arrived via [facility] from [facility] - Pt here for Code Stroke. On arrival pt is AO.  Pt arrival in MRI 0033 Per [facility] pt symptoms began at 8:30pm - pt had CT head with no contrast due to allergy to contrast that was negative. NIH with [facility] was an 8 due to blurry vision, facial palsy, R arm drift, R leg drift.  Pt refused TNK due to hx of bleeding ulcers.  [facility] had pt on Cardene drip with a BP goal of 160-180 - [facility] DC drip here.  VS obtained here  169/94 HR 64 O2 100  RR 20

## 2021-03-13 NOTE — ED Notes (Signed)
MD Sith from Neuro requestiing RN to give 20 mg of Labetalol r/t to Systolic b/p in 200's.

## 2021-03-13 NOTE — Evaluation (Signed)
Physical Therapy Evaluation Patient Details Name: Cassandra Hayden MRN: [id number] DOB: 01-11-1951 Today's Date: 03/13/2021  History of Present Illness  Pt is a 70 y/o female admitted secondary to R extremity weakness and speech difficulty. Imaging revealed L pons infarct. PMH includes TIA and seizures.  Clinical Impression  Pt admitted secondary to problem above with deficits below. Pt with functional weakness in RLE. Noted mild LOB during gait, requiring min A for steadying. Otherwise required min guard A for mobility tasks. Recommending outpatient PT to address current deficits. Will continue to follow acutely.        Recommendations for follow up therapy are one component of a multi-disciplinary discharge planning process, led by the attending physician.  Recommendations may be updated based on patient status, additional functional criteria and insurance authorization.  Follow Up Recommendations Outpatient PT;Supervision for mobility/OOB (neuro outpatient)    Equipment Recommendations  None recommended by PT    Recommendations for Other Services       Precautions / Restrictions Precautions Precautions: Fall Restrictions Weight Bearing Restrictions: No      Mobility  Bed Mobility Overal bed mobility: Needs Assistance Bed Mobility: Supine to Sit;Sit to Supine     Supine to sit: Min guard Sit to supine: Min guard   General bed mobility comments: Min guard for safety to perform bed mobility on stretcher.    Transfers Overall transfer level: Needs assistance Equipment used: None Transfers: Sit to/from Stand Sit to Stand: Min guard         General transfer comment: Min guard for safety  Ambulation/Gait Ambulation/Gait assistance: Min guard;Min assist Gait Distance (Feet): 25 Feet Assistive device: None Gait Pattern/deviations: Step-through pattern;Decreased stride length Gait velocity: Decreased   General Gait Details: Slow, cautious gait. Pt with 1 LOB during gait,  requiring min A for steadying. Otherwise required min guard for safety. Educated about usin RW for increased safety.  Stairs            Wheelchair Mobility    Modified Rankin (Stroke Patients Only)       Balance Overall balance assessment: Needs assistance Sitting-balance support: No upper extremity supported;Feet supported Sitting balance-Leahy Scale: Good     Standing balance support: No upper extremity supported;During functional activity Standing balance-Leahy Scale: Fair                               Pertinent Vitals/Pain Pain Assessment: Faces Faces Pain Scale: Hurts little more Pain Location: R thigh down into foot Pain Descriptors / Indicators: Cramping Pain Intervention(s): Limited activity within patient's tolerance;Monitored during session;Repositioned    Home Living Family/patient expects to be discharged to:: Private residence Living Arrangements: Alone Available Help at Discharge: Friend(s);Available 24 hours/day Type of Home: House Home Access: Stairs to enter Entrance Stairs-Rails: None Entrance Stairs-Number of Steps: 1 Home Layout: One level Home Equipment: Wheelchair - [employer] - 2 wheels;Cane - single point;Wheelchair - power      Prior Function Level of Independence: Independent               Hand Dominance        Extremity/Trunk Assessment   Upper Extremity Assessment Upper Extremity Assessment: Defer to OT evaluation    Lower Extremity Assessment Lower Extremity Assessment: RLE deficits/detail RLE Deficits / Details: Reporting RLE felt heavy and had cramps. Was able to perform SLR, heel slide and ankle pump. Functional weakness noted.    Cervical / Trunk Assessment Cervical / Trunk Assessment:  Normal  Communication   Communication: No difficulties  Cognition Arousal/Alertness: Awake/alert Behavior During Therapy: WFL for tasks assessed/performed Overall Cognitive Status: Within Functional Limits for  tasks assessed                                        General Comments      Exercises     Assessment/Plan    PT Assessment Patient needs continued PT services  PT Problem List Decreased strength;Decreased activity tolerance;Decreased balance;Decreased mobility;Decreased knowledge of use of DME;Decreased coordination       PT Treatment Interventions DME instruction;Gait training;Stair training;Functional mobility training;Therapeutic activities;Therapeutic exercise;Balance training;Patient/family education;Cognitive remediation    PT Goals (Current goals can be found in the Care Plan section)  Acute Rehab PT Goals Patient Stated Goal: to go home PT Goal Formulation: With patient Time For Goal Achievement: 03/27/21 Potential to Achieve Goals: Good    Frequency Min 4X/week   Barriers to discharge        Co-evaluation               AM-PAC PT "6 Clicks" Mobility  Outcome Measure Help needed turning from your back to your side while in a flat bed without using bedrails?: A Little Help needed moving from lying on your back to sitting on the side of a flat bed without using bedrails?: A Little Help needed moving to and from a bed to a chair (including a wheelchair)?: A Little Help needed standing up from a chair using your arms (e.g., wheelchair or bedside chair)?: A Little Help needed to walk in hospital room?: A Little Help needed climbing 3-5 steps with a railing? : A Little 6 Click Score: 18    End of Session Equipment Utilized During Treatment: Gait belt Activity Tolerance: Patient tolerated treatment well Patient left: in bed;with call bell/phone within reach (on stretcher on ED) Nurse Communication: Mobility status PT Visit Diagnosis: Other abnormalities of gait and mobility (R26.89);Unsteadiness on feet (R26.81);Muscle weakness (generalized) (M62.81)    Time: 6387-5643 PT Time Calculation (min) (ACUTE ONLY): 15 min   Charges:   PT  Evaluation $PT Eval Moderate Complexity: 1 Mod          Farley Ly, PT, DPT  Acute Rehabilitation Services  Pager: [phone] Office: [phone]   Lehman Prom 03/13/2021, 1:10 PM

## 2021-03-13 NOTE — Progress Notes (Addendum)
STROKE TEAM PROGRESS NOTE   INTERVAL HISTORY 70 year old white female with history of seizures, uncontrolled medicated dyslipidemia, and iodinated contrast allergy presented with right sided weakness in the setting of profound hypotension (70./40). additionally she complained of slurred speech, and blurry vision. MRI brain shows acute left pontine stroke, etiology like small vessel disease.  LDL cholesterol significantly elevated to 253 mg percent.  Hemoglobin A1c is 5.2.  Urine drug screen is negative.  Echocardiogram is pending She complains of chronic daily headaches and has been taking several tablets of Excedrin every day.  She takes Inderal for headache prophylaxis.  Blood pressure is significantly elevated on the monitor at the bedside No one is present at bedside at time of this exam.   Vitals:   03/13/21 0815 03/13/21 0830 03/13/21 1030 03/13/21 1145  BP: (!) 199/87 (!) 187/59 (!) 221/86 (!) 192/92  Pulse: 71 (!) 54 73 66  Resp: (!) 25 10 18 18   Temp:      TempSrc:      SpO2: 100% 96% 100% 100%  Weight:      Height:       CBC:  Recent Labs  Lab 03/12/21 2311 03/13/21 0443  WBC 8.0 7.1  NEUTROABS 3.9  --   HGB 10.1* 11.4*  HCT 32.0* 34.9*  MCV 90.4 87.7  PLT 264 298   Basic Metabolic Panel:  Recent Labs  Lab 03/12/21 2311 03/13/21 0443  NA 139  --   K 3.9  --   CL 108  --   CO2 24  --   GLUCOSE 96  --   BUN 25*  --   CREATININE 1.66* 1.64*  CALCIUM 8.6*  --     Lipid Panel:  Recent Labs  Lab 03/13/21 0443  CHOL 346*  TRIG 207*  HDL 52  CHOLHDL 6.7  VLDL 41*  LDLCALC 253*    HgbA1c:  Recent Labs  Lab 03/13/21 0443  HGBA1C 5.2   Urine Drug Screen:  Recent Labs  Lab 03/13/21 0225  LABOPIA NONE DETECTED  COCAINSCRNUR NONE DETECTED  LABBENZ NONE DETECTED  AMPHETMU NONE DETECTED  THCU NONE DETECTED  LABBARB NONE DETECTED    Alcohol Level  Recent Labs  Lab 03/12/21 2311  ETH <10    IMAGING past 24 hours MR ANGIO HEAD WO  CONTRAST  Result Date: 03/13/2021 CLINICAL DATA:  Dizziness.  Right arm weakness. EXAM: MRI HEAD WITHOUT CONTRAST MRA HEAD WITHOUT CONTRAST MRA NECK WITHOUT CONTRAST TECHNIQUE: Multiplanar, multiecho pulse sequences of the brain and surrounding structures were obtained without intravenous contrast. Angiographic images of the Circle of Willis were obtained using MRA technique without intravenous contrast. Angiographic images of the neck were obtained using MRA technique without intravenous contrast. Carotid stenosis measurements (when applicable) are obtained utilizing NASCET criteria, using the distal internal carotid diameter as the denominator. COMPARISON:  None. FINDINGS: MRI HEAD FINDINGS Brain: Small focus of abnormal diffusion restriction of the ventral left pons. No other diffusion abnormality. No acute or chronic hemorrhage. There is multifocal hyperintense T2-weighted signal within the white matter. Generalized volume loss without a clear lobar predilection. The midline structures are normal. Vascular: Major flow voids are preserved. Skull and upper cervical spine: Normal calvarium and skull base. Visualized upper cervical spine and soft tissues are normal. Sinuses/Orbits:No paranasal sinus fluid levels or advanced mucosal thickening. No mastoid or middle ear effusion. Normal orbits. MRA HEAD FINDINGS POSTERIOR CIRCULATION: --Vertebral arteries: Normal --Inferior cerebellar arteries: Normal. --Basilar artery: Normal. --Superior cerebellar arteries: Normal. --  Posterior cerebral arteries: Short segment occlusion of the right P1 segment. Distal right PCA is patent. Severe stenosis of the proximal left P2 segment. ANTERIOR CIRCULATION: --Intracranial internal carotid arteries: Normal. --Anterior cerebral arteries (ACA): Normal. --Middle cerebral arteries (MCA): Moderate stenosis of the proximal right M2 segment. Bilateral atherosclerotic irregularity, possibly exaggerated by motion. ANATOMIC VARIANTS: None  MRA NECK FINDINGS The MRA of the neck was not completed due to patient claustrophobia and inability to tolerate the remainder of the examination. The obtained images are severely motion degraded. IMPRESSION: 1. Small focus of acute ischemia of the ventral left pons. No hemorrhage or mass effect. 2. Short segment occlusion of the right PCA P1 segment. Severe stenosis of the proximal left P2 segment. 3. Moderate stenosis of the proximal right MCA M2 segment. 4. Severely motion degraded and truncated examination of the neck. Electronically Signed   By: Deatra Robinson M.D.   On: 03/13/2021 02:41   MR ANGIO NECK WO CONTRAST  Result Date: 03/13/2021 CLINICAL DATA:  Dizziness.  Right arm weakness. EXAM: MRI HEAD WITHOUT CONTRAST MRA HEAD WITHOUT CONTRAST MRA NECK WITHOUT CONTRAST TECHNIQUE: Multiplanar, multiecho pulse sequences of the brain and surrounding structures were obtained without intravenous contrast. Angiographic images of the Circle of Willis were obtained using MRA technique without intravenous contrast. Angiographic images of the neck were obtained using MRA technique without intravenous contrast. Carotid stenosis measurements (when applicable) are obtained utilizing NASCET criteria, using the distal internal carotid diameter as the denominator. COMPARISON:  None. FINDINGS: MRI HEAD FINDINGS Brain: Small focus of abnormal diffusion restriction of the ventral left pons. No other diffusion abnormality. No acute or chronic hemorrhage. There is multifocal hyperintense T2-weighted signal within the white matter. Generalized volume loss without a clear lobar predilection. The midline structures are normal. Vascular: Major flow voids are preserved. Skull and upper cervical spine: Normal calvarium and skull base. Visualized upper cervical spine and soft tissues are normal. Sinuses/Orbits:No paranasal sinus fluid levels or advanced mucosal thickening. No mastoid or middle ear effusion. Normal orbits. MRA HEAD  FINDINGS POSTERIOR CIRCULATION: --Vertebral arteries: Normal --Inferior cerebellar arteries: Normal. --Basilar artery: Normal. --Superior cerebellar arteries: Normal. --Posterior cerebral arteries: Short segment occlusion of the right P1 segment. Distal right PCA is patent. Severe stenosis of the proximal left P2 segment. ANTERIOR CIRCULATION: --Intracranial internal carotid arteries: Normal. --Anterior cerebral arteries (ACA): Normal. --Middle cerebral arteries (MCA): Moderate stenosis of the proximal right M2 segment. Bilateral atherosclerotic irregularity, possibly exaggerated by motion. ANATOMIC VARIANTS: None MRA NECK FINDINGS The MRA of the neck was not completed due to patient claustrophobia and inability to tolerate the remainder of the examination. The obtained images are severely motion degraded. IMPRESSION: 1. Small focus of acute ischemia of the ventral left pons. No hemorrhage or mass effect. 2. Short segment occlusion of the right PCA P1 segment. Severe stenosis of the proximal left P2 segment. 3. Moderate stenosis of the proximal right MCA M2 segment. 4. Severely motion degraded and truncated examination of the neck. Electronically Signed   By: Deatra Robinson M.D.   On: 03/13/2021 02:41   MR BRAIN WO CONTRAST  Result Date: 03/13/2021 CLINICAL DATA:  Dizziness.  Right arm weakness. EXAM: MRI HEAD WITHOUT CONTRAST MRA HEAD WITHOUT CONTRAST MRA NECK WITHOUT CONTRAST TECHNIQUE: Multiplanar, multiecho pulse sequences of the brain and surrounding structures were obtained without intravenous contrast. Angiographic images of the Circle of Willis were obtained using MRA technique without intravenous contrast. Angiographic images of the neck were obtained using MRA technique without intravenous contrast.  Carotid stenosis measurements (when applicable) are obtained utilizing NASCET criteria, using the distal internal carotid diameter as the denominator. COMPARISON:  None. FINDINGS: MRI HEAD FINDINGS Brain:  Small focus of abnormal diffusion restriction of the ventral left pons. No other diffusion abnormality. No acute or chronic hemorrhage. There is multifocal hyperintense T2-weighted signal within the white matter. Generalized volume loss without a clear lobar predilection. The midline structures are normal. Vascular: Major flow voids are preserved. Skull and upper cervical spine: Normal calvarium and skull base. Visualized upper cervical spine and soft tissues are normal. Sinuses/Orbits:No paranasal sinus fluid levels or advanced mucosal thickening. No mastoid or middle ear effusion. Normal orbits. MRA HEAD FINDINGS POSTERIOR CIRCULATION: --Vertebral arteries: Normal --Inferior cerebellar arteries: Normal. --Basilar artery: Normal. --Superior cerebellar arteries: Normal. --Posterior cerebral arteries: Short segment occlusion of the right P1 segment. Distal right PCA is patent. Severe stenosis of the proximal left P2 segment. ANTERIOR CIRCULATION: --Intracranial internal carotid arteries: Normal. --Anterior cerebral arteries (ACA): Normal. --Middle cerebral arteries (MCA): Moderate stenosis of the proximal right M2 segment. Bilateral atherosclerotic irregularity, possibly exaggerated by motion. ANATOMIC VARIANTS: None MRA NECK FINDINGS The MRA of the neck was not completed due to patient claustrophobia and inability to tolerate the remainder of the examination. The obtained images are severely motion degraded. IMPRESSION: 1. Small focus of acute ischemia of the ventral left pons. No hemorrhage or mass effect. 2. Short segment occlusion of the right PCA P1 segment. Severe stenosis of the proximal left P2 segment. 3. Moderate stenosis of the proximal right MCA M2 segment. 4. Severely motion degraded and truncated examination of the neck. Electronically Signed   By: Deatra Robinson M.D.   On: 03/13/2021 02:41   CT HEAD CODE STROKE WO CONTRAST  Result Date: 03/12/2021 CLINICAL DATA:  Code stroke.  Sudden onset weakness  EXAM: CT HEAD WITHOUT CONTRAST TECHNIQUE: Contiguous axial images were obtained from the base of the skull through the vertex without intravenous contrast. COMPARISON:  None. FINDINGS: Brain: There is no mass, hemorrhage or extra-axial collection. The size and configuration of the ventricles and extra-axial CSF spaces are normal. There is hypoattenuation of the periventricular white matter, most commonly indicating chronic ischemic microangiopathy. Vascular: No abnormal hyperdensity of the major intracranial arteries or dural venous sinuses. No intracranial atherosclerosis. Skull: The visualized skull base, calvarium and extracranial soft tissues are normal. Sinuses/Orbits: No fluid levels or advanced mucosal thickening of the visualized paranasal sinuses. No mastoid or middle ear effusion. The orbits are normal. ASPECTS [facility] Stroke Program Early CT Score) - Ganglionic level infarction (caudate, lentiform nuclei, internal capsule, insula, M1-M3 cortex): 7 - Supraganglionic infarction (M4-M6 cortex): 3 Total score (0-10 with 10 being normal): 10 IMPRESSION: 1. Chronic ischemic microangiopathy without acute intracranial abnormality. 2. ASPECTS is 10. These results were called by telephone at the time of interpretation on 03/12/2021 at 11:16 pm to provider [facility] , who verbally acknowledged these results. Electronically Signed   By: Deatra Robinson M.D.   On: 03/12/2021 23:14   VAS US CAROTID  Result Date: 03/13/2021 Carotid Arterial Duplex Study Patient Name:  KOBE JANSMA  Date of Exam:   03/13/2021 Medical Rec #: [id number]    Accession #:    [id number] Date of Birth: Mar 10, 1951    Patient Gender: F Patient Age:   56 years Exam Location:  [facility] Procedure:      VAS US CAROTID Referring Phys: Brooke Dare --------------------------------------------------------------------------------  Indications:       CVA. Risk Factors:  Hyperlipidemia, no history of smoking. Other Factors:     TIA,  CKD3. Comparison Study:  No previous exams Performing Technologist: Jody Hill RVT, RDMS  Examination Guidelines: A complete evaluation includes B-mode imaging, spectral Doppler, color Doppler, and power Doppler as needed of all accessible portions of each vessel. Bilateral testing is considered an integral part of a complete examination. Limited examinations for reoccurring indications may be performed as noted.  Right Carotid Findings: +----------+--------+--------+--------+------------------+---------------------+           PSV cm/sEDV cm/sStenosisPlaque DescriptionComments              +----------+--------+--------+--------+------------------+---------------------+ CCA Prox  108     17                                VERY tortuous &                                                           intimal thickening    +----------+--------+--------+--------+------------------+---------------------+ CCA Distal70      16                                intimal thickening    +----------+--------+--------+--------+------------------+---------------------+ ICA Prox  74      21                                                      +----------+--------+--------+--------+------------------+---------------------+ ICA Distal68      24                                                      +----------+--------+--------+--------+------------------+---------------------+ ECA       82      16              heterogenous                            +----------+--------+--------+--------+------------------+---------------------+ +----------+--------+-------+----------------+-------------------+           PSV cm/sEDV cmsDescribe        Arm Pressure (mmHG) +----------+--------+-------+----------------+-------------------+ [id number]             Multiphasic, WNL                    +----------+--------+-------+----------------+-------------------+  +---------+--------+--+--------+-+---------+ VertebralPSV cm/s36EDV cm/s9Antegrade +---------+--------+--+--------+-+---------+  Left Carotid Findings: +----------+--------+--------+--------+------------------+------------------+           PSV cm/sEDV cm/sStenosisPlaque DescriptionComments           +----------+--------+--------+--------+------------------+------------------+ CCA Prox  101     16                                intimal thickening +----------+--------+--------+--------+------------------+------------------+ CCA Distal62      17  intimal thickening +----------+--------+--------+--------+------------------+------------------+ ICA Prox  84      22                                                   +----------+--------+--------+--------+------------------+------------------+ ICA Distal61      22                                                   +----------+--------+--------+--------+------------------+------------------+ ECA       68      12                                                   +----------+--------+--------+--------+------------------+------------------+ +----------+--------+--------+----------------+-------------------+           PSV cm/sEDV cm/sDescribe        Arm Pressure (mmHG) +----------+--------+--------+----------------+-------------------+ [id number]              Multiphasic, WNL                    +----------+--------+--------+----------------+-------------------+ +---------+--------+--+--------+-+---------+ VertebralPSV cm/s33EDV cm/s6Antegrade +---------+--------+--+--------+-+---------+   Summary: Right Carotid: The extracranial vessels were near-normal with only minimal wall                thickening or plaque. Left Carotid: The extracranial vessels were near-normal with only minimal wall               thickening or plaque. Vertebrals:  Bilateral vertebral arteries demonstrate  antegrade flow. Subclavians: Normal flow hemodynamics were seen in bilateral subclavian              arteries. *See table(s) above for measurements and observations.     Preliminary     PHYSICAL EXAM  Mental Status: Patient is awake, alert, oriented to person, place, month, year, and situation. Patient is able to give a clear and coherent history. No signs of aphasia or neglect Cranial Nerves: II: Visual Fields are full. Pupils are equal, round, and reactive to light.   III,IV, VI: EOMI without ptosis or diploplia.  V: Facial sensation is symmetric to light touch VII: Facial movement is notable for right facial droop VIII: hearing is intact to voice X: Uvula elevates symmetrically XI: Shoulder shrug is symmetric. XII: tongue is midline without atrophy or fasciculations.  Motor: Tone is normal. Bulk is normal. persistent right upper extremity sensory drift. Sensory: Sensation is symmetric to light touch  in the arms and legs. Deep Tendon Reflexes: 2+ and symmetric in the biceps and patellae.  Cerebellar: FNF and HKS are intact bilaterally  ASSESSMENT/PLAN Ms. Brinley Rosete is a 70 y.o. female with history of   seizures, uncontrolled unmedicated hyperlipidemia, TIA, anxiety, iodinated contrast allergy.   She reports she was in her usual state of health until 9 PM -- she had an episode of dizziness at 8:30 PM associated with low BP (70s/40s) which is not unusual for her and at the time she had no focal weakness/numbness. At 9 PM she got out of bed and was trying to drink some fluids to help raise her BP and noticed she was weak  on the right side. Denies HA (reports this is unusual as she typically has a constant headache).    She was evaluated by teleneurology and refused TNKase in the setting of not being willing to accept the bleeding risk.  Deficits as reported at this time are right upper extremity weakness and slurred speech, with a total NIH score stroke scale of 4 (one-point for  facial droop, one-point for right arm drift, one-point for right leg drift, one-point for dysarthria).  Additional imaging was recommended to exclude large vessel occlusion but given contrast allergy patient again refused and she is being emergently transferred to [facility] for MRI brain and MRA head/neck from [facility] per teleneurologist evaluation and concern that her blurry vision may represent LVO.     Regarding her seizure history she has "electrolyte imbalance seizures" without any recent seizure activity and had had bleeding stomach ulcer 1 month ago (bloody stool, with severe GI infection in 2020 and ulcers on endoscopy in 2005).    No other neurology notes can be found in our EMR, but she does have a psychiatry note from 10/27/2018 at which time she was admitted under IVC for suicidal ideation with mania in the setting of her husband having recently passed away  Stroke:  left pons infarct embolic secondary to  small vessel disease   Code Stroke  CT head No acute abnormality.  Small vessel disease. Atrophy.  ASPECTS 10.     MRI BRAIN: Small focus of acute ischemia of the ventral left pons. No hemorrhage or mass effect. MRA  2. Short segment occlusion of the right PCA P1 segment. Severe stenosis of the proximal left P2 segment. 3. Moderate stenosis of the proximal right MCA M2 segment. 4. Severely motion degraded and truncated examination of the neck.  2D Echo ejection fraction 60 to 65%.  No wall motion abnormalities.   LDL 253 HgbA1c 5.2 VTE prophylaxis - scd    Diet   Diet Heart Room service appropriate? Yes; Fluid consistency: Thin   No antithrombotic prior to admission, now on aspirin 81 mg daily and clopidogrel 75 mg daily. For 3 weeks followed by aspirin alone.  Therapy recommendations: Outpatient PT  disposition: Home  Hypertension Home meds:  inderal LA 80mg  Unstable Permissive hypertension (OK if < 220/120) but gradually normalize in 5-7  days Long-term BP goal normotensive  Hyperlipidemia Home meds:  none LDL 253, goal < 70 Add lipitor 80mg   High intensity statin   Continue statin at discharge  HgbA1c 5.2, goal < 7.0 CBGs Recent Labs    03/12/21 2314  GLUCAP 91    SSI  Other Stroke Risk Factors  Advanced Age >/= 34   Other Active Problems Seizure history: unclear  Chronic headaches: will add topamax 50mg  bid prn   Hospital day # 0   I have personally obtained history,examined this patient, reviewed notes, independently viewed imaging studies, participated in medical decision making and plan of care.ROS completed by me personally and pertinent positives fully documented  I have made any additions or clarifications directly to the above note. Agree with note above.  Patient presented with small left pontine lacunar infarct likely from small vessel disease.  She has significantly elevated LDL cholesterol of 2 5 3  mg percent.  She also has chronic neck daily headaches which likely represents analgesic rebound headache Recommend aspirin and Plavix for 3 weeks followed by aspirin alone.  Add full dose statin.  Start Topamax 50 mg twice daily for headache  prophylaxis.  Patient counseled to discontinue Excedrin Migraine as she is taking too many tablets which is contributing to rebound headaches.  Follow-up as an outpatient in the stroke clinic for 2 months.  Discussed with Dr. Thedore Mins.  Greater than 50% time during this 35-minute visit was spent in counseling and coordination of care about a lacunar stroke and chronic daily headaches and answered questions.  Stroke team will sign off.  Kindly call for questions. Delia Heady, MD Medical Director [facility] Pager: [phone] 03/13/2021 3:08 PM   To contact Stroke Continuity provider, please refer to [email or web address]. After hours, contact General Neurology

## 2021-03-13 NOTE — ED Notes (Signed)
Pt remains in MRI at this time  

## 2021-03-13 NOTE — Progress Notes (Signed)
Code stroke  Beeper   2257  Start   2258 Finished  2305 Soc   2306 Epic   2306 Rad   2307

## 2021-03-13 NOTE — ED Notes (Addendum)
Pt tells staff that she is claustrophobic. MD Bhagat notified and verbal order given for 1mg  of Ativan. 1mg  of Ativan verified by RN and administered by this RN.

## 2021-03-13 NOTE — ED Provider Notes (Signed)
  Patient presents in transfer from [facility] as a code stroke.  Patient initially presented there with speech difficulty, facial droop, right-sided weakness.  Was last in her normal state of health at around 8:30 PM.  She was evaluated by teleneurology and EDP.  She declined TKNase and was unable to get CT imaging secondary to a contrast allergy.  She was sent back [facility] for emergent evaluation with MRI/MRA.  She went immediately to the MRI scanner upon arrival.  Neurology evaluated patient.  NIH score 4.  Per neurology, pontine stroke.  No LVO.  See neurology consult for full details and recommendations.  Recommend admission for stroke work-up.  On my evaluation, patient is awake, alert, oriented.  She confirms history.  No complaints at this time.  Noted to have some right-sided facial droop and some right upper extremity drift with dysarthria.  Physical Exam  BP (!) 155/105   Pulse 64   Temp 97.7 F (36.5 C) (Oral)   Resp 18   Ht 1.676 m (5\' 6" )   Wt 66.9 kg   SpO2 100%   BMI 23.79 kg/m   Physical Exam  ED Course/Procedures     Procedures  MDM   Problem List Items Addressed This Visit   None Visit Diagnoses     Acute ischemic stroke [facility])    -  Primary   Relevant Medications   aspirin tablet 325 mg   aspirin EC tablet 81 mg (Start on 03/14/2021 10:00 AM)   atorvastatin (LIPITOR) tablet 80 mg             03/16/2021, MD 03/13/21 [phone]

## 2021-03-13 NOTE — ED Notes (Signed)
Pt rolled out door to go to [facility] with [facility].   On phone with [facility] C-RN

## 2021-03-13 NOTE — H&P (Signed)
History and Physical    Cassandra Hayden XBJ:[id number] DOB: [birth year] DOA: 03/12/2021  PCP: Margot Ables, MD  Patient coming from: Home.  Chief Complaint: Right-sided weakness.  HPI: Cassandra Hayden is a 70 y.o. female with history of hyperlipidemia unmedicated, TIA, anxiety, iodinated contrast allergy and seizures at around 8:30 PM last night started experiencing dizziness and when she checked her blood pressure was around 70 systolic.  Patient around 9 PM got up to drink some fluid when she noticed that she was weak on the right side.  Patient was brought to the ER.  Patient states she also had some slurred speech.  ED Course: In the ER patient had CT head and was evaluated by tele neurologist and patient declined tPA.  Patient also declined getting CT angiogram of the head and neck due to severe contrast allergies.  Patient was transferred to [facility] for further work-up and was seen by neurologist Dr. Iver Nestle.  Patient had MRI of the brain and MRA of the head and neck.  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  Patient blood pressure is markedly elevated initially was on Cardene but subsequently weaned off.  Labs show hemoglobin of 10 creatinine 1.6.  LDL of 253.  COVID test was negative.  Urine drug screen negative.  EKG normal sinus rhythm.  Review of Systems: As per HPI, rest all negative.   Past Medical History:  Diagnosis Date   Anxiety    Arthritis    Asthma    GERD (gastroesophageal reflux disease)    Seizures (HCC)    TIA (transient ischemic attack)    Ulcer (traumatic) of oral mucosa abdominal    History reviewed. No pertinent surgical history.   reports that she has never smoked. She has never used smokeless tobacco. She reports that she does not drink alcohol and does not  use drugs.  Allergies  Allergen Reactions   Cephalosporins     Anaphylaxis     Ergotamine     anaphylaxis     Iodinated Diagnostic Agents Anaphylaxis   Iodine     Including contrast dye - anaphylaxis  Including contrast dye - anaphylaxis     Penicillins     Anaphylaxis     Povidone-Iodine     Anaphylaxis     Quinine     Anaphylaxis  Anaphylaxis     Sulfa Antibiotics     Anaphylaxis  Anaphylaxis     Acacia     Blistering of the mouth  Blistering of the mouth     Pineapple     Blistering of the mouth  Blistering of the mouth     Vegetable Oil     Anaphylaxis  Anaphylaxis     Yeast     Blistering of the mouth  Blistering of the mouth      Family History  Problem Relation Age of Onset   Cancer Mother    Hyperlipidemia Mother    Hypertension Mother    Heart disease Father    Mental illness Father     Prior to Admission medications   Medication Sig Start Date End Date Taking? Authorizing Provider  B Complex-C (SUPER B COMPLEX PO) Take by mouth.    [provider]  Cholecalciferol (D-3-5) 125 MCG (5000 UT) capsule Take 5,000 Units by mouth  daily.    [provider]  gabapentin (NEURONTIN) 100 MG capsule Take 100 mg by mouth 2 (two) times daily.    [provider]  lamoTRIgine (LAMICTAL) 100 MG tablet Take 100 mg by mouth daily.    [provider]  LORATADINE PO Take 10 mg by mouth daily.    [provider]  magnesium gluconate (MAGONATE) 500 MG tablet Take 500 mg by mouth 2 (two) times daily.    [provider]  Multiple Vitamins-Minerals (MULTIVITAMIN WITH MINERALS) tablet Take 1 tablet by mouth daily.    [provider]  omeprazole (PRILOSEC) 20 MG capsule Take 20 mg by mouth daily.    [provider]  propranolol (INDERAL) 40 MG tablet Take 1 tablet (40 mg total) by mouth 3 (three) times daily. 06/23/19   Corum, Minerva Fester, MD  tiZANidine (ZANAFLEX) 4 MG capsule Take 4 mg by mouth 3  (three) times daily.    [provider]  vitamin E 400 UNIT capsule Take 400 Units by mouth daily.    [provider]    Physical Exam: Constitutional: Moderately built and nourished. Vitals:   03/13/21 0300 03/13/21 0400 03/13/21 0415 03/13/21 0445  BP: (!) 200/65 (!) 205/104    Pulse: 68 (!) 58 (!) 56 63  Resp: 16 18 20 13   Temp:      TempSrc:      SpO2: 100% 100% 100% 100%  Weight:      Height:       Eyes: Anicteric no pallor. ENMT: No discharge from the ears eyes nose and mouth. Neck: No mass felt.  No neck rigidity. Respiratory: No rhonchi or crepitations. Cardiovascular: S1-S2 heard. Abdomen: Soft nontender bowel sound present. Musculoskeletal: No edema. Skin: No rash. Neurologic: Alert awake oriented to time place and person.  Has right facial and right upper extremity as pronator drift and right lower extremity also has drift.  Left upper and lower extremity is 5 x 5.  Pupils equal reacting to light.  Tongue is midline. Psychiatric: Appears normal.  Normal affect.   Labs on Admission: I have personally reviewed following labs and imaging studies  CBC: Recent Labs  Lab 03/12/21 2311 03/13/21 0443  WBC 8.0 7.1  NEUTROABS 3.9  --   HGB 10.1* 11.4*  HCT 32.0* 34.9*  MCV 90.4 87.7  PLT 264 298   Basic Metabolic Panel: Recent Labs  Lab 03/12/21 2311  NA 139  K 3.9  CL 108  CO2 24  GLUCOSE 96  BUN 25*  CREATININE 1.66*  CALCIUM 8.6*   GFR: Estimated Creatinine Clearance: 28.7 mL/min (A) (by C-G formula based on SCr of 1.66 mg/dL (H)). Liver Function Tests: Recent Labs  Lab 03/12/21 2311  AST 17  ALT 11  ALKPHOS 65  BILITOT 0.5  PROT 7.0  ALBUMIN 4.0   No results for input(s): LIPASE, AMYLASE in the last 168 hours. No results for input(s): AMMONIA in the last 168 hours. Coagulation Profile: Recent Labs  Lab 03/12/21 2311  INR 1.0   Cardiac Enzymes: No results for input(s): CKTOTAL, CKMB, CKMBINDEX, TROPONINI in the last  168 hours. BNP (last 3 results) No results for input(s): PROBNP in the last 8760 hours. HbA1C: Recent Labs    03/13/21 0443  HGBA1C 5.2   CBG: Recent Labs  Lab 03/12/21 2314  GLUCAP 91   Lipid Profile: No results for input(s): CHOL, HDL, LDLCALC, TRIG, CHOLHDL, LDLDIRECT in the last 72 hours. Thyroid Function Tests: No results for  input(s): TSH, T4TOTAL, FREET4, T3FREE, THYROIDAB in the last 72 hours. Anemia Panel: No results for input(s): VITAMINB12, FOLATE, FERRITIN, TIBC, IRON, RETICCTPCT in the last 72 hours. Urine analysis:    Component Value Date/Time   COLORURINE YELLOW 03/13/2021 0226   APPEARANCEUR HAZY (A) 03/13/2021 0226   LABSPEC 1.009 03/13/2021 0226   PHURINE 8.0 03/13/2021 0226   GLUCOSEU NEGATIVE 03/13/2021 0226   HGBUR NEGATIVE 03/13/2021 0226   BILIRUBINUR NEGATIVE 03/13/2021 0226   KETONESUR NEGATIVE 03/13/2021 0226   PROTEINUR NEGATIVE 03/13/2021 0226   NITRITE NEGATIVE 03/13/2021 0226   LEUKOCYTESUR MODERATE (A) 03/13/2021 0226   Sepsis Labs: @LABRCNTIP (procalcitonin:4,lacticidven:4) ) Recent Results (from the past 240 hour(s))  Resp Panel by RT-PCR (Flu A&B, Covid) Nasopharyngeal Swab     Status: None   Collection Time: 03/12/21 11:15 PM   Specimen: Nasopharyngeal Swab; Nasopharyngeal(NP) swabs in vial transport medium  Result Value Ref Range Status   SARS Coronavirus 2 by RT PCR NEGATIVE NEGATIVE Final    Comment: (NOTE) SARS-CoV-2 target nucleic acids are NOT DETECTED.  The SARS-CoV-2 RNA is generally detectable in upper respiratory specimens during the acute phase of infection. The lowest concentration of SARS-CoV-2 viral copies this assay can detect is 138 copies/mL. A negative result does not preclude SARS-Cov-2 infection and should not be used as the sole basis for treatment or other patient management decisions. A negative result may occur with  improper specimen collection/handling, submission of specimen other than nasopharyngeal  swab, presence of viral mutation(s) within the areas targeted by this assay, and inadequate number of viral copies(<138 copies/mL). A negative result must be combined with clinical observations, patient history, and epidemiological information. The expected result is Negative.  Fact Sheet for Patients:  BloggerCourse.com  Fact Sheet for Healthcare Providers:  SeriousBroker.it  This test is no t yet approved or cleared by the [place] FDA and  has been authorized for detection and/or diagnosis of SARS-CoV-2 by FDA under an Emergency Use Authorization (EUA). This EUA will remain  in effect (meaning this test can be used) for the duration of the COVID-19 declaration under Section 564(b)(1) of the Act, 21 U.S.C.section 360bbb-3(b)(1), unless the authorization is terminated  or revoked sooner.       Influenza A by PCR NEGATIVE NEGATIVE Final   Influenza B by PCR NEGATIVE NEGATIVE Final    Comment: (NOTE) The Xpert Xpress SARS-CoV-2/FLU/RSV plus assay is intended as an aid in the diagnosis of influenza from Nasopharyngeal swab specimens and should not be used as a sole basis for treatment. Nasal washings and aspirates are unacceptable for Xpert Xpress SARS-CoV-2/FLU/RSV testing.  Fact Sheet for Patients: BloggerCourse.com  Fact Sheet for Healthcare Providers: SeriousBroker.it  This test is not yet approved or cleared by the [place] FDA and has been authorized for detection and/or diagnosis of SARS-CoV-2 by FDA under an Emergency Use Authorization (EUA). This EUA will remain in effect (meaning this test can be used) for the duration of the COVID-19 declaration under Section 564(b)(1) of the Act, 21 U.S.C. section 360bbb-3(b)(1), unless the authorization is terminated or revoked.  Performed at [facility] [place], [address], [place], [place] [address]      Radiological  Exams on Admission: MR ANGIO HEAD WO CONTRAST  Result Date: 03/13/2021 CLINICAL DATA:  Dizziness.  Right arm weakness. EXAM: MRI HEAD WITHOUT CONTRAST MRA HEAD WITHOUT CONTRAST MRA NECK WITHOUT CONTRAST TECHNIQUE: Multiplanar, multiecho pulse sequences of the brain and surrounding structures were obtained without intravenous contrast. Angiographic images of the Circle  of Willis were obtained using MRA technique without intravenous contrast. Angiographic images of the neck were obtained using MRA technique without intravenous contrast. Carotid stenosis measurements (when applicable) are obtained utilizing NASCET criteria, using the distal internal carotid diameter as the denominator. COMPARISON:  None. FINDINGS: MRI HEAD FINDINGS Brain: Small focus of abnormal diffusion restriction of the ventral left pons. No other diffusion abnormality. No acute or chronic hemorrhage. There is multifocal hyperintense T2-weighted signal within the white matter. Generalized volume loss without a clear lobar predilection. The midline structures are normal. Vascular: Major flow voids are preserved. Skull and upper cervical spine: Normal calvarium and skull base. Visualized upper cervical spine and soft tissues are normal. Sinuses/Orbits:No paranasal sinus fluid levels or advanced mucosal thickening. No mastoid or middle ear effusion. Normal orbits. MRA HEAD FINDINGS POSTERIOR CIRCULATION: --Vertebral arteries: Normal --Inferior cerebellar arteries: Normal. --Basilar artery: Normal. --Superior cerebellar arteries: Normal. --Posterior cerebral arteries: Short segment occlusion of the right P1 segment. Distal right PCA is patent. Severe stenosis of the proximal left P2 segment. ANTERIOR CIRCULATION: --Intracranial internal carotid arteries: Normal. --Anterior cerebral arteries (ACA): Normal. --Middle cerebral arteries (MCA): Moderate stenosis of the proximal right M2 segment. Bilateral atherosclerotic irregularity, possibly exaggerated  by motion. ANATOMIC VARIANTS: None MRA NECK FINDINGS The MRA of the neck was not completed due to patient claustrophobia and inability to tolerate the remainder of the examination. The obtained images are severely motion degraded. IMPRESSION: 1. Small focus of acute ischemia of the ventral left pons. No hemorrhage or mass effect. 2. Short segment occlusion of the right PCA P1 segment. Severe stenosis of the proximal left P2 segment. 3. Moderate stenosis of the proximal right MCA M2 segment. 4. Severely motion degraded and truncated examination of the neck. Electronically Signed   By: Deatra Robinson M.D.   On: 03/13/2021 02:41   MR ANGIO NECK WO CONTRAST  Result Date: 03/13/2021 CLINICAL DATA:  Dizziness.  Right arm weakness. EXAM: MRI HEAD WITHOUT CONTRAST MRA HEAD WITHOUT CONTRAST MRA NECK WITHOUT CONTRAST TECHNIQUE: Multiplanar, multiecho pulse sequences of the brain and surrounding structures were obtained without intravenous contrast. Angiographic images of the Circle of Willis were obtained using MRA technique without intravenous contrast. Angiographic images of the neck were obtained using MRA technique without intravenous contrast. Carotid stenosis measurements (when applicable) are obtained utilizing NASCET criteria, using the distal internal carotid diameter as the denominator. COMPARISON:  None. FINDINGS: MRI HEAD FINDINGS Brain: Small focus of abnormal diffusion restriction of the ventral left pons. No other diffusion abnormality. No acute or chronic hemorrhage. There is multifocal hyperintense T2-weighted signal within the white matter. Generalized volume loss without a clear lobar predilection. The midline structures are normal. Vascular: Major flow voids are preserved. Skull and upper cervical spine: Normal calvarium and skull base. Visualized upper cervical spine and soft tissues are normal. Sinuses/Orbits:No paranasal sinus fluid levels or advanced mucosal thickening. No mastoid or middle ear  effusion. Normal orbits. MRA HEAD FINDINGS POSTERIOR CIRCULATION: --Vertebral arteries: Normal --Inferior cerebellar arteries: Normal. --Basilar artery: Normal. --Superior cerebellar arteries: Normal. --Posterior cerebral arteries: Short segment occlusion of the right P1 segment. Distal right PCA is patent. Severe stenosis of the proximal left P2 segment. ANTERIOR CIRCULATION: --Intracranial internal carotid arteries: Normal. --Anterior cerebral arteries (ACA): Normal. --Middle cerebral arteries (MCA): Moderate stenosis of the proximal right M2 segment. Bilateral atherosclerotic irregularity, possibly exaggerated by motion. ANATOMIC VARIANTS: None MRA NECK FINDINGS The MRA of the neck was not completed due to patient claustrophobia and inability to tolerate the remainder  of the examination. The obtained images are severely motion degraded. IMPRESSION: 1. Small focus of acute ischemia of the ventral left pons. No hemorrhage or mass effect. 2. Short segment occlusion of the right PCA P1 segment. Severe stenosis of the proximal left P2 segment. 3. Moderate stenosis of the proximal right MCA M2 segment. 4. Severely motion degraded and truncated examination of the neck. Electronically Signed   By: Deatra Robinson M.D.   On: 03/13/2021 02:41   MR BRAIN WO CONTRAST  Result Date: 03/13/2021 CLINICAL DATA:  Dizziness.  Right arm weakness. EXAM: MRI HEAD WITHOUT CONTRAST MRA HEAD WITHOUT CONTRAST MRA NECK WITHOUT CONTRAST TECHNIQUE: Multiplanar, multiecho pulse sequences of the brain and surrounding structures were obtained without intravenous contrast. Angiographic images of the Circle of Willis were obtained using MRA technique without intravenous contrast. Angiographic images of the neck were obtained using MRA technique without intravenous contrast. Carotid stenosis measurements (when applicable) are obtained utilizing NASCET criteria, using the distal internal carotid diameter as the denominator. COMPARISON:  None.  FINDINGS: MRI HEAD FINDINGS Brain: Small focus of abnormal diffusion restriction of the ventral left pons. No other diffusion abnormality. No acute or chronic hemorrhage. There is multifocal hyperintense T2-weighted signal within the white matter. Generalized volume loss without a clear lobar predilection. The midline structures are normal. Vascular: Major flow voids are preserved. Skull and upper cervical spine: Normal calvarium and skull base. Visualized upper cervical spine and soft tissues are normal. Sinuses/Orbits:No paranasal sinus fluid levels or advanced mucosal thickening. No mastoid or middle ear effusion. Normal orbits. MRA HEAD FINDINGS POSTERIOR CIRCULATION: --Vertebral arteries: Normal --Inferior cerebellar arteries: Normal. --Basilar artery: Normal. --Superior cerebellar arteries: Normal. --Posterior cerebral arteries: Short segment occlusion of the right P1 segment. Distal right PCA is patent. Severe stenosis of the proximal left P2 segment. ANTERIOR CIRCULATION: --Intracranial internal carotid arteries: Normal. --Anterior cerebral arteries (ACA): Normal. --Middle cerebral arteries (MCA): Moderate stenosis of the proximal right M2 segment. Bilateral atherosclerotic irregularity, possibly exaggerated by motion. ANATOMIC VARIANTS: None MRA NECK FINDINGS The MRA of the neck was not completed due to patient claustrophobia and inability to tolerate the remainder of the examination. The obtained images are severely motion degraded. IMPRESSION: 1. Small focus of acute ischemia of the ventral left pons. No hemorrhage or mass effect. 2. Short segment occlusion of the right PCA P1 segment. Severe stenosis of the proximal left P2 segment. 3. Moderate stenosis of the proximal right MCA M2 segment. 4. Severely motion degraded and truncated examination of the neck. Electronically Signed   By: Deatra Robinson M.D.   On: 03/13/2021 02:41   CT HEAD CODE STROKE WO CONTRAST  Result Date: 03/12/2021 CLINICAL DATA:   Code stroke.  Sudden onset weakness EXAM: CT HEAD WITHOUT CONTRAST TECHNIQUE: Contiguous axial images were obtained from the base of the skull through the vertex without intravenous contrast. COMPARISON:  None. FINDINGS: Brain: There is no mass, hemorrhage or extra-axial collection. The size and configuration of the ventricles and extra-axial CSF spaces are normal. There is hypoattenuation of the periventricular white matter, most commonly indicating chronic ischemic microangiopathy. Vascular: No abnormal hyperdensity of the major intracranial arteries or dural venous sinuses. No intracranial atherosclerosis. Skull: The visualized skull base, calvarium and extracranial soft tissues are normal. Sinuses/Orbits: No fluid levels or advanced mucosal thickening of the visualized paranasal sinuses. No mastoid or middle ear effusion. The orbits are normal. ASPECTS [facility] Stroke Program Early CT Score) - Ganglionic level infarction (caudate, lentiform nuclei, internal capsule, insula, M1-M3 cortex): 7 -  Supraganglionic infarction (M4-M6 cortex): 3 Total score (0-10 with 10 being normal): 10 IMPRESSION: 1. Chronic ischemic microangiopathy without acute intracranial abnormality. 2. ASPECTS is 10. These results were called by telephone at the time of interpretation on 03/12/2021 at 11:16 pm to provider [facility] , who verbally acknowledged these results. Electronically Signed   By: Deatra Robinson M.D.   On: 03/12/2021 23:14    EKG: Independently reviewed.  Normal sinus rhythm.  Assessment/Plan Active Problems:   Seizure disorder (HCC)   Cerebral thrombosis with cerebral infarction   Acute CVA (cerebrovascular accident) (HCC)   CKD (chronic kidney disease), stage III (HCC)   HLD (hyperlipidemia)   Anemia    Acute CVA -appreciate neurology recommendation.  At this time due to patient having right P1 occlusion neurochecks have been ordered for next 6 hours hourly.  With instruction to the nurse to activate code  stroke for any neurological change.  Presently on aspirin Plavix statins.  Neurochecks.  Patient did pass stroke swallow but holding of diet until further instruction from neurologist.  Carotid Doppler has been ordered.  Check 2D echo.  Elevated blood pressure with hypertensive urgency was initially on Cardene drip which has been subsequently weaned off.  Allow for permissive hypertension.  As needed IV hydralazine ordered for systolic BP more than 2 20-3 more than 120.  Follow blood pressure trends. Unmedicated hyperlipidemia presently on statins. Chronic kidney disease stage III follow metabolic panel. Anemia likely from renal disease follow CBC. Patient has severe allergy to canola oil and was notified patient is nervous about the diet without canola oil.  Given the acute CVA with the above findings will need close monitoring and inpatient status.   DVT prophylaxis: Lovenox. Code Status: Full code. Family Communication: Discussed with patient. Disposition Plan: Home. Consults called: Neurology. Admission status: Inpatient.   Eduard Clos MD [facility] Pager [phone].  If 7PM-7AM, please contact night-coverage [email or web address] Password [facility]  03/13/2021, 5:24 AM

## 2021-03-13 NOTE — ED Notes (Addendum)
MD Bhagat at bedside - pt to be on [facility] q1 and pt to use bedside commode.

## 2021-03-13 NOTE — ED Notes (Signed)
This RN went into room after hearing things fall in the room. Pt stated " I threw the salad and plate on floor because I'm tired and hungry. I haven't slept, I haven't ate because they're not giving me food I can eat, this bed is terrible, and I don't have a pillow. I'm gonna do what I should have done earlier and leave." This RN informed her that if she wanted to leave AMA, she would need to sign AMA form and have IV taken out. Pt stated "I WILL NOT sign an AMA form." I asked pt "What can I do for you right now? I cannot help how bad the bed is and I am sorry, and I tried to order you food but the cafeteria is closed." Pt then stated asked if "she can order take out? Since they haven't gave her food she can eat." Which I stated "was fine and that the cafeteria was following the order the doctor put in." Pt then stated " the doctor is a fucking idiot." At this time the NT came in to check on this RN since pt was yelling, this RN stated she was okay, and the NT turned around and left, never spoke to the pt. The pt then sat up and yelled " Oh WHAT ARE YOU GOING TO DO?" Charge RN was sitting at desk across from pt's room and this RN asked if she pt could be escorted out since she was acting the way that she was, Consulting civil engineer stated pt could leave. This RN attempted to take IV out and pt refused. This RN walked out of room and pt stated " you're a bitch!." Security was called, Consulting civil engineer got IV out of pt. Pt walked out with security after getting dressed.

## 2021-03-23 DIAGNOSIS — G8310 Monoplegia of lower limb affecting unspecified side: Secondary | ICD-10-CM | POA: Insufficient documentation

## 2021-03-23 DIAGNOSIS — R269 Unspecified abnormalities of gait and mobility: Secondary | ICD-10-CM | POA: Insufficient documentation

## 2021-05-03 ENCOUNTER — Encounter: Payer: Self-pay | Admitting: Neurology

## 2021-05-18 NOTE — Progress Notes (Signed)
NEUROLOGY CONSULTATION NOTE  [name] MRN: [id number] DOB: [birth year]  Referring provider: Margot Ables, MD Primary care provider: Margot Ables, MD  Reason for consult:  stroke, headache  Assessment/Plan:   Left pontine stroke, likely small vessel disease Chronic migraine/migraine with aura Hypertension Hyperlipidemia  1 Recommend starting CGRP inhibitor - Aimovig, Ajovy or Emgality - will find out which is approved by insurance. 2  She will try Nurtec for migraine rescue.  Triptans are contraindicated with history of stroke 3  Limit use of pain relievers to no more than 2 days out of week to prevent risk of rebound or medication-overuse headache. 4  Keep headache diary 5  Secondary stroke prevention as managed by PCP:  - ASA 81mg  daily  - Statin.  LDL goal less than 70  - Normotensive blood pressure  - Hgb A1c goal less than 7 6  Follow up 6 months.   Subjective:  Cassandra Hayden is a 70 year old right-handed female who presents for headache and stroke.  History supplemented by hospital and referring provider's notes.  Current medications:  atorvastatin 40mg , propranolol ER 80mg , Excedrin once a day  Presented to [facility] on 03/13/2021 for right leg cramp, right arm weakness, perioral numbness and speech disturbance.  CT head showed chronic small vessel ischemic changes but no acute abnormality.  She was started on IV tPA.  She declined CTA of head and neck due to severe contrast allergy.  Transferred to [facility] where MRI of brain showed acute left pontine stroke.  MRA of head showed short segment right P1 loss of signal with distal clear follow representing chronic occlusion vs severe stenosis, as well as moderate stenosis of right MCA M2 segment.  MRA of neck nondiagnostic due to movement artifact but carotid ultrasound showed no hemodynamically significant ICA stenosis and antegrade flow of both VAs.[facility]  Echocardiogram showed EF 60-65%  with no cardiac source of emboli.  LDL was 253.  Hgb A1c was 5.2.  EKG showed NSR.  No antithrombotic therapy prior to admission. She was discharged on ASA 81mg  and Plavix 75mg  daily for 3 weeks followed by ASA alone.  Started on atorvastatin daily.    She has history of migraines and chronic daily headaches since sustaining head injury/skull fracture at age 14.  Typical migraine are severe pounding headaches with unilateral vision loss, nausea, photophobia, phonophobia and osmophobia lasting 2 days and occurring 4 to 5 times a week.  She takes at least one Excedrin Migraine a day (and therefore does not take the ASA 81mg  because it is in Excedrin).  Since this stroke, she has had a new headache, a non-throbbing bandlike pain with posterior throbbing associated with dizziness and kaleidoscope vision in either eye lasting 4-6 hours and occurring 2 to 3 times a week.  In total, she averages 20 headache days a month but has at least a dull headache daily.  Right hand still feels a little funny but symptoms of stroke have otherwise resolved.  Past medical history is notable for provoked seizure in setting of hyponatremia.  She takes lamotrigine for Bipolar affective disorder. 18-20 days  Still can't make fist with right hand.  Balance better   Past medication:  topiramate, depakote, amitriptyline, nortriptyline, venlafaxine, verapamil, sumatriptan, rizatriptan.  She has allergy to Botox.  PAST MEDICAL HISTORY: Past Medical History:  Diagnosis Date   Anxiety    Arthritis    Asthma    GERD (gastroesophageal reflux disease)    Seizures (HCC)  TIA (transient ischemic attack)    Ulcer (traumatic) of oral mucosa abdominal    PAST SURGICAL HISTORY: No past surgical history on file.  MEDICATIONS: Current Outpatient Medications on File Prior to Visit  Medication Sig Dispense Refill   Albuterol Sulfate (PROAIR RESPICLICK) 108 (90 Base) MCG/ACT AEPB Inhale 2 puffs into the lungs every 6 (six) hours as  needed (wheezing, shortness of breath).     B Complex-C (SUPER B COMPLEX PO) Take 1 tablet by mouth daily.     Cholecalciferol (D-3-5) 125 MCG (5000 UT) capsule Take 5,000 Units by mouth daily.     EPINEPHrine (EPIPEN 2-PAK IJ) Inject 0.3 mg as directed once as needed (allergic reaction).     lamoTRIgine (LAMICTAL) 100 MG tablet Take 100 mg by mouth daily as needed (for seizure activity).     LORATADINE PO Take 10 mg by mouth daily.     magnesium gluconate (MAGONATE) 500 MG tablet Take 500 mg by mouth 2 (two) times daily.     Multiple Vitamins-Minerals (MULTIVITAMIN WITH MINERALS) tablet Take 1 tablet by mouth daily.     nystatin (MYCOSTATIN/NYSTOP) powder Apply 1 application topically daily as needed (for rash).     omeprazole (PRILOSEC) 20 MG capsule Take 20 mg by mouth daily.     propranolol ER (INDERAL LA) 80 MG 24 hr capsule Take 80 mg by mouth daily.     tiZANidine (ZANAFLEX) 4 MG capsule Take 4 mg by mouth 3 (three) times daily as needed for muscle spasms.     traZODone (DESYREL) 50 MG tablet Take 50 mg by mouth at bedtime as needed for sleep.     vitamin E 400 UNIT capsule Take 400 Units by mouth daily.     No current facility-administered medications on file prior to visit.    ALLERGIES: Allergies  Allergen Reactions   Cephalosporins     Anaphylaxis     Ergotamine     anaphylaxis     Iodinated Diagnostic Agents Anaphylaxis   Iodine     Including contrast dye - anaphylaxis  Including contrast dye - anaphylaxis     Penicillins     Anaphylaxis     Povidone-Iodine     Anaphylaxis     Quinine     Anaphylaxis  Anaphylaxis     Sulfa Antibiotics     Anaphylaxis  Anaphylaxis     Acacia     Blistering of the mouth  Blistering of the mouth     Pineapple     Blistering of the mouth  Blistering of the mouth     Vegetable Oil     Anaphylaxis  Anaphylaxis     Yeast     Blistering of the mouth  Blistering of the mouth      FAMILY HISTORY: Family History   Problem Relation Age of Onset   Cancer Mother    Hyperlipidemia Mother    Hypertension Mother    Heart disease Father    Mental illness Father     Objective:  Blood pressure 125/77, pulse 97, height 5' (1.524 m), weight 142 lb 12.8 oz (64.8 kg), SpO2 95 %. General: No acute distress.  Patient appears well-groomed.   Head:  Normocephalic/atraumatic Eyes:  fundi examined but not visualized Neck: supple, no paraspinal tenderness, full range of motion Back: No paraspinal tenderness Heart: regular rate and rhythm Lungs: Clear to auscultation bilaterally. Vascular: No carotid bruits. Neurological Exam: Mental status: alert and oriented to person, place, and time, recent and remote  memory intact, fund of knowledge intact, attention and concentration intact, speech fluent and not dysarthric, language intact. Cranial nerves: CN I: not tested CN II: pupils equal, round and reactive to light, visual fields intact CN III, IV, VI:  full range of motion, no nystagmus, no ptosis CN V: facial sensation intact. CN VII: upper and lower face symmetric CN VIII: hearing intact CN IX, X: gag intact, uvula midline CN XI: sternocleidomastoid and trapezius muscles intact CN XII: tongue midline Bulk & Tone: normal, no fasciculations. Motor:  muscle strength 5/5 throughout Sensation:  Pinprick, temperature and vibratory sensation intact. Deep Tendon Reflexes:  2+ throughout,  toes downgoing.   Finger to nose testing:  Without dysmetria.   Heel to shin:  Without dysmetria.   Gait:  Normal station and stride.  Romberg negative.    Thank you for allowing me to take part in the care of this patient.  Shon Millet, DO  CC: Margot Ables, MD

## 2021-05-21 ENCOUNTER — Encounter: Payer: Self-pay | Admitting: Neurology

## 2021-05-21 ENCOUNTER — Other Ambulatory Visit: Payer: Self-pay

## 2021-05-21 ENCOUNTER — Ambulatory Visit (INDEPENDENT_AMBULATORY_CARE_PROVIDER_SITE_OTHER): Payer: Medicare Other | Admitting: Neurology

## 2021-05-21 VITALS — BP 125/77 | HR 97 | Ht 60.0 in | Wt 142.8 lb

## 2021-05-21 DIAGNOSIS — I10 Essential (primary) hypertension: Secondary | ICD-10-CM

## 2021-05-21 DIAGNOSIS — E785 Hyperlipidemia, unspecified: Secondary | ICD-10-CM

## 2021-05-21 DIAGNOSIS — G43109 Migraine with aura, not intractable, without status migrainosus: Secondary | ICD-10-CM | POA: Diagnosis not present

## 2021-05-21 DIAGNOSIS — I639 Cerebral infarction, unspecified: Secondary | ICD-10-CM | POA: Diagnosis not present

## 2021-05-21 NOTE — Patient Instructions (Addendum)
   Take aspirin 81mg  daily and atorvastatin Will start a monthly injection (once every 28 days) - it will be either Aimovig, Emgality or Ajovy - will see which is approved by your insurance Take Nurtec at earliest onset of headache.   Maximum 1 tablet in 24 hours. Limit use of pain relievers to no more than 2 days out of the week.  These medications include acetaminophen, NSAIDs (ibuprofen/Advil/Motrin, naproxen/Aleve, triptans (Imitrex/sumatriptan), Excedrin, and narcotics.  This will help reduce risk of rebound headaches. Be aware of common food triggers:  - Caffeine:  coffee, black tea, cola, Mt. Dew  - Chocolate  - Dairy:  aged cheeses (brie, blue, cheddar, gouda, [place], provolone, Barney, Swiss, etc), chocolate milk, buttermilk, sour cream, limit eggs and yogurt  - Nuts, peanut butter  - Alcohol  - Cereals/grains:  FRESH breads (fresh bagels, sourdough, doughnuts), yeast productions  - Processed/canned/aged/cured meats (pre-packaged deli meats, hotdogs)  - MSG/glutamate:  soy sauce, flavor enhancer, pickled/preserved/marinated foods  - Sweeteners:  aspartame (Equal, Nutrasweet).  Sugar and Splenda are okay  - Vegetables:  legumes (lima beans, lentils, snow peas, fava beans, pinto peans, peas, garbanzo beans), sauerkraut, onions, olives, pickles  - Fruit:  avocados, bananas, citrus fruit (orange, lemon, grapefruit), mango  - Other:  Frozen meals, macaroni and cheese Routine exercise Stay adequately hydrated (aim for 64 oz water daily) Keep headache diary Maintain proper stress management Maintain proper sleep hygiene Do not skip meals Consider supplements:  magnesium citrate 400mg  daily, riboflavin 400mg  daily, coenzyme Q10 100mg  three times daily.

## 2021-05-22 ENCOUNTER — Encounter: Payer: Self-pay | Admitting: Neurology

## 2021-08-13 ENCOUNTER — Telehealth: Payer: Self-pay | Admitting: Neurology

## 2021-08-13 MED ORDER — NURTEC 75 MG PO TBDP: 75.0000 mg | ORAL_TABLET | ORAL | 5 refills | Status: DC | PRN

## 2021-08-13 NOTE — Telephone Encounter (Signed)
Patient would like a script sent to [facility] on [address] [place]. She was given samples and it worked  [facility]	[place]

## 2021-08-29 ENCOUNTER — Telehealth: Payer: Self-pay

## 2021-08-29 NOTE — Telephone Encounter (Signed)
New message   Cassandra Hayden Key: Algis Greenhouse help? Call us at [phone] Status New(Not sent to plan) Cannot find matching patient with Name and Date of Birth provided. For additional information, please contact the phone number on the back of the member prescription ID card. Drug Nurtec 75MG  dispersible tablets Form [employer] Medicare Part D Electronic Prior Authorization Form (2017 NCPDP)

## 2022-01-03 NOTE — Progress Notes (Signed)
NEUROLOGY FOLLOW UP OFFICE NOTE  Cassandra Hayden [id number]  Assessment/Plan:   Migraine with aura, without status migrainosus, not intractable Left pontine stroke, likely small vessel disease Hypertension Hyperlipidemia   1 Plan to start Aimovig 140mg  every 28 days 2  She will try samples of Ubrelvy 100mg  for migraine rescue.  Triptans are contraindicated with history of stroke 3  Limit use of pain relievers to no more than 2 days out of week to prevent risk of rebound or medication-overuse headache. 4  Keep headache diary 5  Secondary stroke prevention as managed by PCP:             - ASA 81mg  daily             - Statin.  LDL goal less than 70             - Normotensive blood pressure             - Hgb A1c goal less than 7 6  Follow up 4 months.     Subjective:  Cassandra Hayden is a 71 year old right-handed female who follows up for migraine and stroke.   UPDATE: Current medications:  atorvastatin 40mg , propranolol ER 80mg , Excedrin once a day  Migraines only occur with storms.  Occurs 5 days a month.  Nurtec was ineffective.   HISTORY: Presented to [facility] on 03/13/2021 for right leg cramp, right arm weakness, perioral numbness and speech disturbance.  CT head showed chronic small vessel ischemic changes but no acute abnormality.  She was started on IV tPA.  She declined CTA of head and neck due to severe contrast allergy.  Transferred to [facility] where MRI of brain showed acute left pontine stroke.  MRA of head showed short segment right P1 loss of signal with distal clear follow representing chronic occlusion vs severe stenosis, as well as moderate stenosis of right MCA M2 segment.  MRA of neck nondiagnostic due to movement artifact but carotid ultrasound showed no hemodynamically significant ICA stenosis and antegrade flow of both VAs.  Echocardiogram showed EF 60-65% with no cardiac source of emboli.  LDL was 253.  Hgb A1c was 5.2.  EKG showed NSR.  No  antithrombotic therapy prior to admission. She was discharged on ASA 81mg  and Plavix 75mg  daily for 3 weeks followed by ASA alone.  Started on atorvastatin daily.    She has history of migraines and chronic daily headaches since sustaining head injury/skull fracture at age 28.  Preceded by visual aura in right eye consisting of kaleidoscope vision for 30 minutes followed by vision loss in right up up to an hour.  Typical migraine are severe pounding headaches with nausea, photophobia, phonophobia and osmophobia lasting 2 days and occurring 4 to 5 times a week.  She takes at least one Excedrin Migraine a day (and therefore does not take the ASA 81mg  because it is in Excedrin).  Since this stroke, she has had a new headache, a non-throbbing bandlike pain with posterior throbbing associated with dizziness and kaleidoscope vision in either eye lasting 4-6 hours and occurring 2 to 3 times a week.  In total, she averages 20 headache days a month but has at least a dull headache daily.  Right hand still feels a little funny but symptoms of stroke have otherwise resolved.   Past medical history is notable for provoked seizure in setting of hyponatremia.  She takes lamotrigine for Bipolar affective disorder.  Past medication:  topiramate, depakote, amitriptyline, nortriptyline, venlafaxine, verapamil, sumatriptan tab/[place], rizatriptan, zolmitriptan, Nurtec, stadol, Vicodin.  She has allergy to Botox.  PAST MEDICAL HISTORY: Past Medical History:  Diagnosis Date   Anxiety    Arthritis    Asthma    GERD (gastroesophageal reflux disease)    Seizures (HCC)    TIA (transient ischemic attack)    Ulcer (traumatic) of oral mucosa abdominal    MEDICATIONS: Current Outpatient Medications on File Prior to Visit  Medication Sig Dispense Refill   Albuterol Sulfate (PROAIR RESPICLICK) 108 (90 Base) MCG/ACT AEPB Inhale 2 puffs into the lungs every 6 (six) hours as needed (wheezing, shortness of breath).      atorvastatin (LIPITOR) 40 MG tablet Take 40 mg by mouth at bedtime.     B Complex-C (SUPER B COMPLEX PO) Take 1 tablet by mouth daily.     Cholecalciferol (D-3-5) 125 MCG (5000 UT) capsule Take 5,000 Units by mouth daily.     EPINEPHrine (EPIPEN 2-PAK IJ) Inject 0.3 mg as directed once as needed (allergic reaction).     lamoTRIgine (LAMICTAL) 100 MG tablet Take 100 mg by mouth daily as needed (for seizure activity).     LORATADINE PO Take 10 mg by mouth daily.     magnesium gluconate (MAGONATE) 500 MG tablet Take 500 mg by mouth 2 (two) times daily.     Multiple Vitamins-Minerals (MULTIVITAMIN WITH MINERALS) tablet Take 1 tablet by mouth daily.     nystatin (MYCOSTATIN/NYSTOP) powder Apply 1 application topically daily as needed (for rash).     omeprazole (PRILOSEC) 20 MG capsule Take 20 mg by mouth daily.     propranolol ER (INDERAL LA) 80 MG 24 hr capsule Take 80 mg by mouth daily.     Rimegepant Sulfate (NURTEC) 75 MG TBDP Take 75 mg by mouth as needed. 16 tablet 5   tiZANidine (ZANAFLEX) 4 MG capsule Take 4 mg by mouth 3 (three) times daily as needed for muscle spasms.     traZODone (DESYREL) 50 MG tablet Take 50 mg by mouth at bedtime as needed for sleep.     vitamin E 400 UNIT capsule Take 400 Units by mouth daily.     No current facility-administered medications on file prior to visit.    ALLERGIES: Allergies  Allergen Reactions   Cephalosporins     Anaphylaxis     Ergotamine     anaphylaxis     Iodinated Contrast Media Anaphylaxis   Iodine     Including contrast dye - anaphylaxis  Including contrast dye - anaphylaxis     Penicillins     Anaphylaxis     Povidone-Iodine     Anaphylaxis     Quinine     Anaphylaxis  Anaphylaxis     Sulfa Antibiotics     Anaphylaxis  Anaphylaxis     Acacia     Blistering of the mouth  Blistering of the mouth     Pineapple     Blistering of the mouth  Blistering of the mouth     Vegetable Oil     Anaphylaxis  Anaphylaxis      Yeast     Blistering of the mouth  Blistering of the mouth      FAMILY HISTORY: Family History  Problem Relation Age of Onset   Cancer Mother    Hyperlipidemia Mother    Hypertension Mother    Heart disease Father    Mental illness Father    Parkinsonism Maternal  Aunt    Migraines Granddaughter       Objective:  Blood pressure 139/84, pulse 97, height 5\' 5"  (1.651 m), weight 145 lb (65.8 kg), SpO2 95 %. General: No acute distress.  Patient appears well-groomed.   Head:  Normocephalic/atraumatic    , DO  CC: Shon Millet, MD

## 2022-01-04 ENCOUNTER — Ambulatory Visit (INDEPENDENT_AMBULATORY_CARE_PROVIDER_SITE_OTHER): Payer: Medicare HMO | Admitting: Neurology

## 2022-01-04 ENCOUNTER — Encounter: Payer: Self-pay | Admitting: Neurology

## 2022-01-04 VITALS — BP 139/84 | HR 97 | Ht 65.0 in | Wt 145.0 lb

## 2022-01-04 DIAGNOSIS — G43109 Migraine with aura, not intractable, without status migrainosus: Secondary | ICD-10-CM | POA: Diagnosis not present

## 2022-01-04 DIAGNOSIS — E785 Hyperlipidemia, unspecified: Secondary | ICD-10-CM

## 2022-01-04 DIAGNOSIS — I10 Essential (primary) hypertension: Secondary | ICD-10-CM

## 2022-01-04 DIAGNOSIS — I639 Cerebral infarction, unspecified: Secondary | ICD-10-CM

## 2022-01-04 MED ORDER — UBRELVY 100 MG PO TABS: 100.0000 mg | ORAL_TABLET | ORAL | 0 refills | Status: DC | PRN

## 2022-01-04 MED ORDER — AIMOVIG 140 MG/ML ~~LOC~~ SOAJ: 140.0000 mg | SUBCUTANEOUS | 5 refills | Status: DC

## 2022-01-04 NOTE — Addendum Note (Signed)
Addended by: Lenise Herald on: 01/04/2022 02:22 PM   Modules accepted: Orders

## 2022-01-04 NOTE — Patient Instructions (Signed)
Start Aimovig 140mg  every 28 days At earliest onset of migraine, take Ubrelvy 100mg .  May repeat after 2 hours.  Maximum 2 tablets in 24 hours.  Let me know if effective Continue aspirin 81mg  and atorvastatin daily Follow up 4 months.

## 2022-02-11 ENCOUNTER — Encounter: Payer: Self-pay | Admitting: Family Medicine

## 2022-02-11 ENCOUNTER — Ambulatory Visit (INDEPENDENT_AMBULATORY_CARE_PROVIDER_SITE_OTHER): Payer: Medicare HMO | Admitting: Family Medicine

## 2022-02-11 VITALS — BP 116/60 | HR 74 | Temp 97.3°F | Ht 65.0 in | Wt 142.0 lb

## 2022-02-11 DIAGNOSIS — F319 Bipolar disorder, unspecified: Secondary | ICD-10-CM

## 2022-02-11 DIAGNOSIS — D649 Anemia, unspecified: Secondary | ICD-10-CM | POA: Diagnosis not present

## 2022-02-11 DIAGNOSIS — Z87898 Personal history of other specified conditions: Secondary | ICD-10-CM

## 2022-02-11 DIAGNOSIS — Z87892 Personal history of anaphylaxis: Secondary | ICD-10-CM

## 2022-02-11 DIAGNOSIS — E7801 Familial hypercholesterolemia: Secondary | ICD-10-CM | POA: Insufficient documentation

## 2022-02-11 DIAGNOSIS — N1832 Chronic kidney disease, stage 3b: Secondary | ICD-10-CM

## 2022-02-11 DIAGNOSIS — Z0000 Encounter for general adult medical examination without abnormal findings: Secondary | ICD-10-CM

## 2022-02-11 DIAGNOSIS — Z8673 Personal history of transient ischemic attack (TIA), and cerebral infarction without residual deficits: Secondary | ICD-10-CM

## 2022-02-11 MED ORDER — LEVALBUTEROL HCL 1.25 MG/3ML IN NEBU: 1.2500 mg | INHALATION_SOLUTION | RESPIRATORY_TRACT | 0 refills | Status: DC | PRN

## 2022-02-11 NOTE — Assessment & Plan Note (Signed)
Stable currently. Continue Lamictal and psych followup.

## 2022-02-11 NOTE — Assessment & Plan Note (Signed)
Likely related to CKD. Awaiting labs.

## 2022-02-11 NOTE — Progress Notes (Signed)
Subjective:  Patient ID: Cassandra Hayden, female    DOB: 1950/10/13  Age: 71 y.o. MRN: [id number]  CC: Chief Complaint  Patient presents with   Establish Care    HPI:  71 year old female with the below mentioned medical problems presents to establish care.  Review of the EMR reveals patient has underlying bipolar disorder. Has had a previous suicide attempt. Has had reported seizures as well in the setting of electrolyte disturbance. Was admitted in 09/2018 and very agitated and aggressive during admission. Is seeing psychiatry per her report and is currently taking Lamictal.  Has a history of FH. Last available Lipid panel showed total cholesterol 346 and LDL of 253.  She has had a prior stroke.  Is currently on Lipitor 40 mg daily.   Also has CKD (likely stage 3). Unclear cause. She does not have diabetes or hypertension.  She attributes her kidney disease to administration of medication hospitalization. Has been see by nephrology previously (not in our area).  Has anaphylaxis to multiple medications, contrast, canola oil. She is requesting a refill on Xopenex.   Discussed preventative health care briefly today. Patient declines mammogram and colonoscopy.  Patient Active Problem List   Diagnosis Date Noted   Familial hypercholesteremia 02/11/2022   History of stroke 02/11/2022   History of seizure 02/11/2022   Bipolar disorder ([place]) 02/11/2022   CKD (chronic kidney disease), stage III ([place]) 03/13/2021   Anemia 03/13/2021   Chronic migraine without aura without status migrainosus, not intractable 05/31/2019   Gastroesophageal reflux disease without esophagitis 05/31/2019   Seasonal allergic rhinitis 05/31/2019   Health care maintenance 05/31/2019    Social Hx   Social History   Socioeconomic History   Marital status: Widowed    Spouse name: Not on file   Number of children: Not on file   Years of education: Not on file   Highest education level: Not on file  Occupational  History   Occupation: retired  Tobacco Use   Smoking status: Never   Smokeless tobacco: Never  Substance and Sexual Activity   Alcohol use: Never   Drug use: Never   Sexual activity: Not Currently  Other Topics Concern   Not on file  Social History Narrative   Left mainly can use right   Caffeine 20 oz soda a day   Lives with x husband in a one story home   Social Determinants of Health   Financial Resource Strain: Not on file  Food Insecurity: Not on file  Transportation Needs: Not on file  Physical Activity: Not on file  Stress: Not on file  Social Connections: Not on file    Review of Systems  Respiratory: Negative.    Cardiovascular: Negative.   Musculoskeletal:        Leg cramps.    Objective:  BP 116/60   Pulse 74   Temp (!) 97.3 F (36.3 C)   Ht 5' 5"  (1.651 m)   Wt 142 lb (64.4 kg)   SpO2 99%   BMI 23.63 kg/m      02/11/2022   10:03 AM 01/04/2022    1:05 PM 05/21/2021    4:28 PM  BP/Weight  Systolic BP 627 035 009  Diastolic BP 60 84 77  Wt. (Lbs) 142 145 142.8  BMI 23.63 kg/m2 24.13 kg/m2 27.89 kg/m2    Physical Exam Constitutional:      General: She is not in acute distress.    Appearance: Normal appearance.  HENT:  Head: Normocephalic and atraumatic.  Eyes:     General:        Right eye: No discharge.        Left eye: No discharge.     Conjunctiva/sclera: Conjunctivae normal.  Cardiovascular:     Rate and Rhythm: Normal rate and regular rhythm.     Heart sounds: No murmur heard. Pulmonary:     Effort: Pulmonary effort is normal.     Breath sounds: Normal breath sounds. No wheezing, rhonchi or rales.  Neurological:     Mental Status: She is alert.  Psychiatric:        Mood and Affect: Mood normal.        Behavior: Behavior normal.     Lab Results  Component Value Date   WBC 7.1 03/13/2021   HGB 11.4 (L) 03/13/2021   HCT 34.9 (L) 03/13/2021   PLT 298 03/13/2021   GLUCOSE 96 03/12/2021   CHOL 346 (H) 03/13/2021   TRIG  207 (H) 03/13/2021   HDL 52 03/13/2021   LDLCALC 253 (H) 03/13/2021   ALT 11 03/12/2021   AST 17 03/12/2021   NA 139 03/12/2021   K 3.9 03/12/2021   CL 108 03/12/2021   CREATININE 1.64 (H) 03/13/2021   BUN 25 (H) 03/12/2021   CO2 24 03/12/2021   TSH 0.77 06/21/2019   INR 1.0 03/12/2021   HGBA1C 5.2 03/13/2021     Assessment & Plan:   Problem List Items Addressed This Visit       Genitourinary   CKD (chronic kidney disease), stage III ([place])    Unclear etiology of CKD. Awaiting labs.      Relevant Orders   CMP14+EGFR     Other   Anemia    Likely related to CKD. Awaiting labs.      Relevant Orders   CBC   Iron, TIBC and Ferritin Panel   Bipolar disorder ([place])    Stable currently. Continue Lamictal and psych followup.       Familial hypercholesteremia - Primary    Will likely need statin increase and additional pharmacotherapy to get to goal given prior CVA. Continue lipitor at current dosing while awaiting labs results.      Relevant Orders   Lipid panel   Health care maintenance    Declines colonoscopy and mammogram.      History of seizure   History of stroke    Continue aspirin and statin. Will need to be aggressive with lipid control.       Other Visit Diagnoses     History of anaphylaxis       Relevant Medications   levalbuterol (XOPENEX) 1.25 MG/3ML nebulizer solution       Meds ordered this encounter  Medications   levalbuterol (XOPENEX) 1.25 MG/3ML nebulizer solution    Sig: Take 1.25 mg by nebulization every 4 (four) hours as needed for wheezing.    Dispense:  72 mL    Refill:  0    Follow-up:  Return in about 6 months (around 08/14/2022).  [name]

## 2022-02-11 NOTE — Assessment & Plan Note (Signed)
Unclear etiology of CKD. Awaiting labs.

## 2022-02-11 NOTE — Assessment & Plan Note (Signed)
Continue aspirin and statin. Will need to be aggressive with lipid control.

## 2022-02-11 NOTE — Assessment & Plan Note (Signed)
Will likely need statin increase and additional pharmacotherapy to get to goal given prior CVA. Continue lipitor at current dosing while awaiting labs results.

## 2022-02-11 NOTE — Assessment & Plan Note (Signed)
Declines colonoscopy and mammogram.

## 2022-02-11 NOTE — Patient Instructions (Signed)
Labs today.  Follow up in 6 months.  Take care  Dr. Shatisha Falter  

## 2022-02-12 LAB — LIPID PANEL
Chol/HDL Ratio: 4 ratio (ref 0.0–4.4)
Cholesterol, Total: 167 mg/dL (ref 100–199)
HDL: 42 mg/dL (ref >39)
LDL Chol Calc (NIH): 91 mg/dL (ref 0–99)
Triglycerides: 201 mg/dL — ABNORMAL HIGH (ref 0–149)
VLDL Cholesterol Cal: 34 mg/dL (ref 5–40)

## 2022-02-12 LAB — CMP14+EGFR
ALT: 14 IU/L (ref 0–32)
AST: 17 IU/L (ref 0–40)
Albumin/Globulin Ratio: 1.8 (ref 1.2–2.2)
Albumin: 4.6 g/dL (ref 3.8–4.8)
Alkaline Phosphatase: 74 IU/L (ref 44–121)
BUN/Creatinine Ratio: 10 — ABNORMAL LOW (ref 12–28)
BUN: 17 mg/dL (ref 8–27)
Bilirubin Total: 0.4 mg/dL (ref 0.0–1.2)
CO2: 22 mmol/L (ref 20–29)
Calcium: 10 mg/dL (ref 8.7–10.3)
Chloride: 104 mmol/L (ref 96–106)
Creatinine, Ser: 1.77 mg/dL — ABNORMAL HIGH (ref 0.57–1.00)
Globulin, Total: 2.6 g/dL (ref 1.5–4.5)
Glucose: 106 mg/dL — ABNORMAL HIGH (ref 70–99)
Potassium: 4.8 mmol/L (ref 3.5–5.2)
Sodium: 141 mmol/L (ref 134–144)
Total Protein: 7.2 g/dL (ref 6.0–8.5)
eGFR: 30 mL/min/{1.73_m2} — ABNORMAL LOW (ref >59)

## 2022-02-12 LAB — CBC
Hematocrit: 34.2 % (ref 34.0–46.6)
Hemoglobin: 11.4 g/dL (ref 11.1–15.9)
MCH: 28.8 pg (ref 26.6–33.0)
MCHC: 33.3 g/dL (ref 31.5–35.7)
MCV: 86 fL (ref 79–97)
Platelets: 284 10*3/uL (ref 150–450)
RBC: 3.96 x10E6/uL (ref 3.77–5.28)
RDW: 13.6 % (ref 11.7–15.4)
WBC: 7.8 10*3/uL (ref 3.4–10.8)

## 2022-02-12 LAB — IRON,TIBC AND FERRITIN PANEL
Ferritin: 33 ng/mL (ref 15–150)
Iron Saturation: 16 % (ref 15–55)
Iron: 52 ug/dL (ref 27–139)
Total Iron Binding Capacity: 333 ug/dL (ref 250–450)
UIBC: 281 ug/dL (ref 118–369)

## 2022-02-12 NOTE — Addendum Note (Signed)
Addended by: Margaretha Sheffield on: 02/12/2022 10:38 AM   Modules accepted: Orders

## 2022-04-08 ENCOUNTER — Ambulatory Visit (INDEPENDENT_AMBULATORY_CARE_PROVIDER_SITE_OTHER): Payer: Medicare HMO | Admitting: Family Medicine

## 2022-04-08 VITALS — BP 177/106 | HR 68 | Temp 98.2°F | Ht 65.0 in | Wt 140.0 lb

## 2022-04-08 DIAGNOSIS — F410 Panic disorder [episodic paroxysmal anxiety] without agoraphobia: Secondary | ICD-10-CM

## 2022-04-08 MED ORDER — CLONAZEPAM 0.5 MG PO TABS
0.5000 mg | ORAL_TABLET | Freq: Two times a day (BID) | ORAL | 1 refills | Status: DC | PRN
Start: 2022-04-08 — End: 2022-10-29

## 2022-04-08 NOTE — Patient Instructions (Signed)
Medication as directed.  Follow up in 1 week or sooner if needed.  Take care  Dr. Lacinda Axon

## 2022-04-09 DIAGNOSIS — F410 Panic disorder [episodic paroxysmal anxiety] without agoraphobia: Secondary | ICD-10-CM | POA: Insufficient documentation

## 2022-04-09 NOTE — Progress Notes (Signed)
Subjective:  Patient ID: Cassandra Hayden, female    DOB: [birth year]  Age: 71 y.o. MRN: [id number]  CC: Chief Complaint  Patient presents with   Panic Attack    Started this weekend while in traffic in [place]  And this afternoon - feels like everything is closing in , she feels like palpitations in her chest, keeps rocking in chair     HPI:  71 year old female presents for evaluation of the above.  Patient states that she believes that she is having a panic attack.  She states that she had a similar bout yesterday and then had an additional bout today.  She feels very nervous and anxious.  Feels like room is closing in.  Reports palpitations.  Patient is quite hypertensive today.  Patient states that she is unsure of what has prompted this today.  Yesterday she was stuck in traffic and then had the same symptoms.  Patient reports that she has chronic insomnia.  Has been going on for the past 5 years.  She states that she has a lack of need of sleep.  I discussed her bipolar disorder with her and she states that it seems to be stable.  Denies any ongoing current stressors.  Patient Active Problem List   Diagnosis Date Noted   Panic attack 04/09/2022   Familial hypercholesteremia 02/11/2022   History of stroke 02/11/2022   History of seizure 02/11/2022   Bipolar disorder ([place]) 02/11/2022   CKD (chronic kidney disease), stage III ([place]) 03/13/2021   Anemia 03/13/2021   Chronic migraine without aura without status migrainosus, not intractable 05/31/2019   Gastroesophageal reflux disease without esophagitis 05/31/2019   Seasonal allergic rhinitis 05/31/2019   Health care maintenance 05/31/2019    Social Hx   Social History   Socioeconomic History   Marital status: Widowed    Spouse name: Not on file   Number of children: Not on file   Years of education: Not on file   Highest education level: Not on file  Occupational History   Occupation: retired  Tobacco Use   Smoking status:  Never   Smokeless tobacco: Never  Substance and Sexual Activity   Alcohol use: Never   Drug use: Never   Sexual activity: Not Currently  Other Topics Concern   Not on file  Social History Narrative   Left mainly can use right   Caffeine 20 oz soda a day   Lives with x husband in a one story home   Social Determinants of Health   Financial Resource Strain: Not on file  Food Insecurity: Not on file  Transportation Needs: Not on file  Physical Activity: Not on file  Stress: Not on file  Social Connections: Not on file    Review of Systems Per HPI  Objective:  BP (!) 177/106   Pulse 68   Temp 98.2 F (36.8 C)   Ht 5\' 5"  (1.651 m)   Wt 140 lb (63.5 kg)   SpO2 100%   BMI 23.30 kg/m      04/08/2022    3:52 PM 02/11/2022   10:03 AM 01/04/2022    1:05 PM  BP/Weight  Systolic BP [id number]  Diastolic BP A999333 60 84  Wt. (Lbs) 140 142 145  BMI 23.3 kg/m2 23.63 kg/m2 24.13 kg/m2    Physical Exam Vitals and nursing note reviewed.  Constitutional:      Comments: Patient is very anxious and is rocking back and forth on the exam table.  HENT:     Head: Normocephalic and atraumatic.  Cardiovascular:     Rate and Rhythm: Normal rate and regular rhythm.  Pulmonary:     Effort: Pulmonary effort is normal.     Breath sounds: Normal breath sounds. No wheezing, rhonchi or rales.  Neurological:     Mental Status: She is alert.  Psychiatric:     Comments: Very nervous and anxious.  Rocking back and forth on the exam table.     Lab Results  Component Value Date   WBC 7.8 02/11/2022   HGB 11.4 02/11/2022   HCT 34.2 02/11/2022   PLT 284 02/11/2022   GLUCOSE 106 (H) 02/11/2022   CHOL 167 02/11/2022   TRIG 201 (H) 02/11/2022   HDL 42 02/11/2022   LDLCALC 91 02/11/2022   ALT 14 02/11/2022   AST 17 02/11/2022   NA 141 02/11/2022   K 4.8 02/11/2022   CL 104 02/11/2022   CREATININE 1.77 (H) 02/11/2022   BUN 17 02/11/2022   CO2 22 02/11/2022   TSH 0.77 06/21/2019   INR  1.0 03/12/2021   HGBA1C 5.2 03/13/2021     Assessment & Plan:   Problem List Items Addressed This Visit       Other   Panic attack - Primary    Patient appears to be experiencing panic attacks/panic disorder.  Concern that she may also be having issues with bipolar disorder although she states that this has been stable despite that she has chronic insomnia and lack of need of sleep. Klonopin as directed.      Relevant Medications   clonazePAM (KLONOPIN) 0.5 MG tablet    Meds ordered this encounter  Medications   clonazePAM (KLONOPIN) 0.5 MG tablet    Sig: Take 1 tablet (0.5 mg total) by mouth 2 (two) times daily as needed for anxiety.    Dispense:  20 tablet    Refill:  1    Follow-up:  1 week  [place]

## 2022-04-09 NOTE — Assessment & Plan Note (Signed)
Patient appears to be experiencing panic attacks/panic disorder.  Concern that she may also be having issues with bipolar disorder although she states that this has been stable despite that she has chronic insomnia and lack of need of sleep. Klonopin as directed.

## 2022-05-10 NOTE — Progress Notes (Deleted)
NEUROLOGY FOLLOW UP OFFICE NOTE  Cassandra Hayden [id number]  Assessment/Plan:   Migraine with aura, without status migrainosus, not intractable Left pontine stroke, likely small vessel disease Hypertension Hyperlipidemia   1 *** 2  ***.  Triptans are contraindicated with history of stroke 3  Limit use of pain relievers to no more than 2 days out of week to prevent risk of rebound or medication-overuse headache. 4  Keep headache diary 5  Secondary stroke prevention as managed by PCP:             - ASA 81mg  daily             - Statin.  LDL goal less than 70             - Normotensive blood pressure             - Hgb A1c goal less than 7 6  Follow up ***.     Subjective:  Cassandra Hayden is a 71 year old right-handed female who follows up for migraine and stroke.   UPDATE: Current medications:  atorvastatin 40mg , propranolol ER 80mg , Excedrin once a day   [place] ubrelvy ***   HISTORY: Presented to [facility] on 03/13/2021 for right leg cramp, right arm weakness, perioral numbness and speech disturbance.  CT head showed chronic small vessel ischemic changes but no acute abnormality.  She was started on IV tPA.  She declined CTA of head and neck due to severe contrast allergy.  Transferred to [facility] where MRI of brain showed acute left pontine stroke.  MRA of head showed short segment right P1 loss of signal with distal clear follow representing chronic occlusion vs severe stenosis, as well as moderate stenosis of right MCA M2 segment.  MRA of neck nondiagnostic due to movement artifact but carotid ultrasound showed no hemodynamically significant ICA stenosis and antegrade flow of both VAs.[facility]  Echocardiogram showed EF 60-65% with no cardiac source of emboli.  LDL was 253.  Hgb A1c was 5.2.  EKG showed NSR.  No antithrombotic therapy prior to admission. She was discharged on ASA 81mg  and Plavix 75mg  daily for 3 weeks followed by ASA alone.  Started on atorvastatin daily.      She has history of migraines and chronic daily headaches since sustaining head injury/skull fracture at age 73.  Preceded by visual aura in right eye consisting of kaleidoscope vision for 30 minutes followed by vision loss in right up up to an hour.  Typical migraine are severe pounding headaches with nausea, photophobia, phonophobia and osmophobia lasting 2 days and occurring 4 to 5 times a week.  She takes at least one Excedrin Migraine a day (and therefore does not take the ASA 81mg  because it is in Excedrin).  Since this stroke, she has had a new headache, a non-throbbing bandlike pain with posterior throbbing associated with dizziness and kaleidoscope vision in either eye lasting 4-6 hours and occurring 2 to 3 times a week.  In total, she averages 20 headache days a month but has at least a dull headache daily.  Right hand still feels a little funny but symptoms of stroke have otherwise resolved.   Past medical history is notable for provoked seizure in setting of hyponatremia.  She takes lamotrigine for Bipolar affective disorder.      Past medication:  topiramate, depakote, amitriptyline, nortriptyline, venlafaxine, verapamil, sumatriptan tab/[place], rizatriptan, zolmitriptan, Nurtec, stadol, Vicodin.  She has allergy to Botox.  PAST MEDICAL HISTORY: Past  Medical History:  Diagnosis Date   Allergy    Anaphylaxis    Anxiety    Arthritis    Asthma    GERD (gastroesophageal reflux disease)    Seizures (HCC)    TIA (transient ischemic attack)     MEDICATIONS: Current Outpatient Medications on File Prior to Visit  Medication Sig Dispense Refill   Albuterol Sulfate (PROAIR RESPICLICK) 108 (90 Base) MCG/ACT AEPB Inhale 2 puffs into the lungs every 6 (six) hours as needed (wheezing, shortness of breath).     aspirin EC 81 MG tablet Take 81 mg by mouth daily. Swallow whole.     atorvastatin (LIPITOR) 40 MG tablet Take 40 mg by mouth at bedtime.     B Complex-C (SUPER B COMPLEX PO) Take 1  tablet by mouth daily.     Cholecalciferol (D-3-5) 125 MCG (5000 UT) capsule Take 5,000 Units by mouth daily.     clonazePAM (KLONOPIN) 0.5 MG tablet Take 1 tablet (0.5 mg total) by mouth 2 (two) times daily as needed for anxiety. 20 tablet 1   EPINEPHrine (EPIPEN 2-PAK IJ) Inject 0.3 mg as directed once as needed (allergic reaction). (Patient not taking: Reported on 02/11/2022)     lamoTRIgine (LAMICTAL) 100 MG tablet Take 100 mg by mouth daily as needed (for seizure activity).     levalbuterol (XOPENEX) 1.25 MG/3ML nebulizer solution Take 1.25 mg by nebulization every 4 (four) hours as needed for wheezing. 72 mL 0   LORATADINE PO Take 10 mg by mouth daily.     magnesium gluconate (MAGONATE) 500 MG tablet Take 500 mg by mouth 2 (two) times daily.     Multiple Vitamins-Minerals (MULTIVITAMIN WITH MINERALS) tablet Take 1 tablet by mouth daily.     nystatin (MYCOSTATIN/NYSTOP) powder Apply 1 application topically daily as needed (for rash).     omeprazole (PRILOSEC) 20 MG capsule Take 20 mg by mouth daily.     OVER THE COUNTER MEDICATION COQ 10     propranolol ER (INDERAL LA) 80 MG 24 hr capsule Take 80 mg by mouth daily.     tiZANidine (ZANAFLEX) 4 MG capsule Take 4 mg by mouth 3 (three) times daily as needed for muscle spasms.     traZODone (DESYREL) 50 MG tablet Take 50 mg by mouth at bedtime as needed for sleep.     vitamin E 400 UNIT capsule Take 400 Units by mouth daily.     No current facility-administered medications on file prior to visit.    ALLERGIES: Allergies  Allergen Reactions   Canola Oil [Vegetable Oil] Anaphylaxis    Anaphylaxis  Anaphylaxis     Cephalosporins     Anaphylaxis     Ergotamine     anaphylaxis     Iodinated Contrast Media Anaphylaxis   Iodine     Including contrast dye - anaphylaxis  Including contrast dye - anaphylaxis     Penicillins     Anaphylaxis     Povidone-Iodine     Anaphylaxis     Quinine     Anaphylaxis  Anaphylaxis     Sulfa  Antibiotics     Anaphylaxis  Anaphylaxis     Acacia     Blistering of the mouth  Blistering of the mouth     Pineapple     Blistering of the mouth  Blistering of the mouth     Yeast     Blistering of the mouth  Blistering of the mouth      FAMILY  HISTORY: Family History  Problem Relation Age of Onset   Cancer Mother    Hyperlipidemia Mother    Hypertension Mother    Heart disease Father    Mental illness Father    Parkinsonism Maternal Aunt    Migraines Granddaughter    Heart attack Son       Objective:  *** General: No acute distress.  Patient appears ***-groomed.   Head:  Normocephalic/atraumatic Eyes:  Fundi examined but not visualized Neck: supple, no paraspinal tenderness, full range of motion Heart:  Regular rate and rhythm Lungs:  Clear to auscultation bilaterally Back: No paraspinal tenderness Neurological Exam: alert and oriented to person, place, and time.  Speech fluent and not dysarthric, language intact.  CN II-XII intact. Bulk and tone normal, muscle strength 5/5 throughout.  Sensation to light touch intact.  Deep tendon reflexes 2+ throughout, toes downgoing.  Finger to nose testing intact.  Gait normal, Romberg negative.   Shon Millet, DO  CC: ***

## 2022-05-13 ENCOUNTER — Encounter: Payer: Self-pay | Admitting: Neurology

## 2022-05-13 ENCOUNTER — Ambulatory Visit: Payer: Medicare HMO | Admitting: Neurology

## 2022-05-13 DIAGNOSIS — Z029 Encounter for administrative examinations, unspecified: Secondary | ICD-10-CM

## 2022-08-14 ENCOUNTER — Ambulatory Visit: Payer: Medicare HMO | Admitting: Family Medicine

## 2022-10-22 ENCOUNTER — Telehealth: Payer: Self-pay | Admitting: Family Medicine

## 2022-10-22 NOTE — Telephone Encounter (Signed)
Contacted Deyci Gesell to schedule their annual wellness visit. Appointment made for 10/25/2022.   Thank you,  Judeth Cornfield,  AMB Clinical Support [facility] AWV Program Direct Dial ??[phone]

## 2022-10-25 ENCOUNTER — Ambulatory Visit (INDEPENDENT_AMBULATORY_CARE_PROVIDER_SITE_OTHER): Payer: Medicare HMO

## 2022-10-25 VITALS — Ht 65.0 in | Wt 140.0 lb

## 2022-10-25 DIAGNOSIS — Z0000 Encounter for general adult medical examination without abnormal findings: Secondary | ICD-10-CM | POA: Diagnosis not present

## 2022-10-25 NOTE — Patient Instructions (Signed)
Cassandra Hayden , Thank you for taking time to come for your Medicare Wellness Visit. I appreciate your ongoing commitment to your health goals. Please review the following plan we discussed and let me know if I can assist you in the future.   These are the goals we discussed:  Goals      Remain active and independent        This is a list of the screening recommended for you and due dates:  Health Maintenance  Topic Date Due   DTaP/Tdap/Td vaccine (1 - Tdap) Never done   COVID-19 Vaccine (1) 11/10/2022*   Zoster (Shingles) Vaccine (1 of 2) 01/24/2023*   Mammogram  02/12/2023*   Colon Cancer Screening  02/12/2023*   DEXA scan (bone density measurement)  10/25/2023*   Hepatitis C Screening: USPSTF Recommendation to screen - Ages 18-79 yo.  10/25/2023*   Flu Shot  01/30/2023   Medicare Annual Wellness Visit  10/25/2023   HPV Vaccine  Aged Out   Pneumonia Vaccine  Discontinued  *Topic was postponed. The date shown is not the original due date.    Advanced directives: Please bring a copy of your health care power of attorney and living will to the office to be added to your chart at your convenience.   Conditions/risks identified: Aim for 30 minutes of exercise or brisk walking, 6-8 glasses of water, and 5 servings of fruits and vegetables each day.   Next appointment: Follow up in one year for your annual wellness visit    Preventive Care 65 Years and Older, Female Preventive care refers to lifestyle choices and visits with your health care provider that can promote health and wellness. What does preventive care include? A yearly physical exam. This is also called an annual well check. Dental exams once or twice a year. Routine eye exams. Ask your health care provider how often you should have your eyes checked. Personal lifestyle choices, including: Daily care of your teeth and gums. Regular physical activity. Eating a healthy diet. Avoiding tobacco and drug use. Limiting alcohol  use. Practicing safe sex. Taking low-dose aspirin every day. Taking vitamin and mineral supplements as recommended by your health care provider. What happens during an annual well check? The services and screenings done by your health care provider during your annual well check will depend on your age, overall health, lifestyle risk factors, and family history of disease. Counseling  Your health care provider may ask you questions about your: Alcohol use. Tobacco use. Drug use. Emotional well-being. Home and relationship well-being. Sexual activity. Eating habits. History of falls. Memory and ability to understand (cognition). Work and work Astronomer. Reproductive health. Screening  You may have the following tests or measurements: Height, weight, and BMI. Blood pressure. Lipid and cholesterol levels. These may be checked every 5 years, or more frequently if you are over 81 years old. Skin check. Lung cancer screening. You may have this screening every year starting at age 49 if you have a 30-pack-year history of smoking and currently smoke or have quit within the past 15 years. Fecal occult blood test (FOBT) of the stool. You may have this test every year starting at age 37. Flexible sigmoidoscopy or colonoscopy. You may have a sigmoidoscopy every 5 years or a colonoscopy every 10 years starting at age 55. Hepatitis C blood test. Hepatitis B blood test. Sexually transmitted disease (STD) testing. Diabetes screening. This is done by checking your blood sugar (glucose) after you have not eaten for a  while (fasting). You may have this done every 1-3 years. Bone density scan. This is done to screen for osteoporosis. You may have this done starting at age 48. Mammogram. This may be done every 1-2 years. Talk to your health care provider about how often you should have regular mammograms. Talk with your health care provider about your test results, treatment options, and if necessary,  the need for more tests. Vaccines  Your health care provider may recommend certain vaccines, such as: Influenza vaccine. This is recommended every year. Tetanus, diphtheria, and acellular pertussis (Tdap, Td) vaccine. You may need a Td booster every 10 years. Zoster vaccine. You may need this after age 72. Pneumococcal 13-valent conjugate (PCV13) vaccine. One dose is recommended after age 75. Pneumococcal polysaccharide (PPSV23) vaccine. One dose is recommended after age 49. Talk to your health care provider about which screenings and vaccines you need and how often you need them. This information is not intended to replace advice given to you by your health care provider. Make sure you discuss any questions you have with your health care provider. Document Released: 07/14/2015 Document Revised: 03/06/2016 Document Reviewed: 04/18/2015 Elsevier Interactive Patient Education  2017 [place] Prevention in the Home Falls can cause injuries. They can happen to people of all ages. There are many things you can do to make your home safe and to help prevent falls. What can I do on the outside of my home? Regularly fix the edges of walkways and driveways and fix any cracks. Remove anything that might make you trip as you walk through a door, such as a raised step or threshold. Trim any bushes or trees on the path to your home. Use bright outdoor lighting. Clear any walking paths of anything that might make someone trip, such as rocks or tools. Regularly check to see if handrails are loose or broken. Make sure that both sides of any steps have handrails. Any raised decks and porches should have guardrails on the edges. Have any leaves, snow, or ice cleared regularly. Use sand or salt on walking paths during winter. Clean up any spills in your garage right away. This includes oil or grease spills. What can I do in the bathroom? Use night lights. Install grab bars by the toilet and in the  tub and shower. Do not use towel bars as grab bars. Use non-skid mats or decals in the tub or shower. If you need to sit down in the shower, use a plastic, non-slip stool. Keep the floor dry. Clean up any water that spills on the floor as soon as it happens. Remove soap buildup in the tub or shower regularly. Attach bath mats securely with double-sided non-slip rug tape. Do not have throw rugs and other things on the floor that can make you trip. What can I do in the bedroom? Use night lights. Make sure that you have a light by your bed that is easy to reach. Do not use any sheets or blankets that are too big for your bed. They should not hang down onto the floor. Have a firm chair that has side arms. You can use this for support while you get dressed. Do not have throw rugs and other things on the floor that can make you trip. What can I do in the kitchen? Clean up any spills right away. Avoid walking on wet floors. Keep items that you use a lot in easy-to-reach places. If you need to reach something above you,  use a strong step stool that has a grab bar. Keep electrical cords out of the way. Do not use floor polish or wax that makes floors slippery. If you must use wax, use non-skid floor wax. Do not have throw rugs and other things on the floor that can make you trip. What can I do with my stairs? Do not leave any items on the stairs. Make sure that there are handrails on both sides of the stairs and use them. Fix handrails that are broken or loose. Make sure that handrails are as long as the stairways. Check any carpeting to make sure that it is firmly attached to the stairs. Fix any carpet that is loose or worn. Avoid having throw rugs at the top or bottom of the stairs. If you do have throw rugs, attach them to the floor with carpet tape. Make sure that you have a light switch at the top of the stairs and the bottom of the stairs. If you do not have them, ask someone to add them for  you. What else can I do to help prevent falls? Wear shoes that: Do not have high heels. Have rubber bottoms. Are comfortable and fit you well. Are closed at the toe. Do not wear sandals. If you use a stepladder: Make sure that it is fully opened. Do not climb a closed stepladder. Make sure that both sides of the stepladder are locked into place. Ask someone to hold it for you, if possible. Clearly mark and make sure that you can see: Any grab bars or handrails. First and last steps. Where the edge of each step is. Use tools that help you move around (mobility aids) if they are needed. These include: Canes. Walkers. Scooters. Crutches. Turn on the lights when you go into a dark area. Replace any light bulbs as soon as they burn out. Set up your furniture so you have a clear path. Avoid moving your furniture around. If any of your floors are uneven, fix them. If there are any pets around you, be aware of where they are. Review your medicines with your doctor. Some medicines can make you feel dizzy. This can increase your chance of falling. Ask your doctor what other things that you can do to help prevent falls. This information is not intended to replace advice given to you by your health care provider. Make sure you discuss any questions you have with your health care provider. Document Released: 04/13/2009 Document Revised: 11/23/2015 Document Reviewed: 07/22/2014 Elsevier Interactive Patient Education  2017 [employer].

## 2022-10-25 NOTE — Progress Notes (Signed)
Subjective:   Cassandra Hayden is a 72 y.o. female who presents for Medicare Annual (Subsequent) preventive examination.  I connected with  Wilkie Aye on 10/25/22 by a audio enabled telemedicine application and verified that I am speaking with the correct person using two identifiers.  Patient Location: Home  Provider Location: Office/Clinic  I discussed the limitations of evaluation and management by telemedicine. The patient expressed understanding and agreed to proceed.  Review of Systems     Cardiac Risk Factors include: advanced age (>11men, >2 women);dyslipidemia     Objective:    Today's Vitals   10/25/22 1309  Weight: 140 lb (63.5 kg)  Height: 5\' 5"  (1.651 m)   Body mass index is 23.3 kg/m.     10/25/2022    1:57 PM 01/04/2022    1:13 PM  Advanced Directives  Does Patient Have a Medical Advance Directive? Yes No  Type of Advance Directive Living will;Healthcare Power of Attorney   Does patient want to make changes to medical advance directive? No - Patient declined   Copy of Healthcare Power of Attorney in Chart? No - copy requested     Current Medications (verified) Outpatient Encounter Medications as of 10/25/2022  Medication Sig   Albuterol Sulfate (PROAIR RESPICLICK) 108 (90 Base) MCG/ACT AEPB Inhale 2 puffs into the lungs every 6 (six) hours as needed (wheezing, shortness of breath).   aspirin EC 81 MG tablet Take 81 mg by mouth daily. Swallow whole.   atorvastatin (LIPITOR) 40 MG tablet Take 40 mg by mouth at bedtime.   B Complex-C (SUPER B COMPLEX PO) Take 1 tablet by mouth daily.   Cholecalciferol (D-3-5) 125 MCG (5000 UT) capsule Take 5,000 Units by mouth daily.   clonazePAM (KLONOPIN) 0.5 MG tablet Take 1 tablet (0.5 mg total) by mouth 2 (two) times daily as needed for anxiety.   EPINEPHrine (EPIPEN 2-PAK IJ) Inject 0.3 mg as directed once as needed (allergic reaction).   lamoTRIgine (LAMICTAL) 100 MG tablet Take 100 mg by mouth daily as needed (for  seizure activity).   LORATADINE PO Take 10 mg by mouth daily.   magnesium gluconate (MAGONATE) 500 MG tablet Take 500 mg by mouth 2 (two) times daily.   Multiple Vitamins-Minerals (MULTIVITAMIN WITH MINERALS) tablet Take 1 tablet by mouth daily.   nystatin (MYCOSTATIN/NYSTOP) powder Apply 1 application topically daily as needed (for rash).   omeprazole (PRILOSEC) 20 MG capsule Take 20 mg by mouth daily.   OVER THE COUNTER MEDICATION COQ 10   propranolol ER (INDERAL LA) 80 MG 24 hr capsule Take 80 mg by mouth daily.   tiZANidine (ZANAFLEX) 4 MG capsule Take 4 mg by mouth 3 (three) times daily as needed for muscle spasms.   traZODone (DESYREL) 50 MG tablet Take 50 mg by mouth at bedtime as needed for sleep.   vitamin E 400 UNIT capsule Take 400 Units by mouth daily.   levalbuterol (XOPENEX) 1.25 MG/3ML nebulizer solution Take 1.25 mg by nebulization every 4 (four) hours as needed for wheezing.   No facility-administered encounter medications on file as of 10/25/2022.    Allergies (verified) Canola oil [vegetable oil], Cephalosporins, Ergotamine, Fluogen [influenza virus vaccine], Iodinated contrast media, Iodine, Penicillins, Pneumovax [pneumococcal polysaccharide vaccine], Povidone-iodine, Quinine, Sulfa antibiotics, Acacia, Pineapple, and Yeast   History: Past Medical History:  Diagnosis Date   Allergy    Anaphylaxis    Anemia Most of my life   Anxiety    Arthritis    Asthma  Chronic kidney disease 2012   I had kidney failure from a medical mistake now have CKD stage 3b   GERD (gastroesophageal reflux disease)    Hyperlipidemia 2 years ago   On statin and it is better i have FH   Seizures (HCC)    Stroke [facility]) 03-2021   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 No pertinent surgical history. Family History  Problem Relation Age of Onset   Cancer Mother     Hyperlipidemia Mother    Hypertension Mother    Arthritis Mother    Heart disease Father    Mental illness Father    Alcohol abuse Father    Parkinsonism Maternal Aunt    Migraines Granddaughter    Heart attack Son    Hyperlipidemia Maternal Grandmother    Stroke Maternal Grandmother    Early death Paternal Grandmother    Social History   Socioeconomic History   Marital status: Widowed    Spouse name: Not on file   Number of children: Not on file   Years of education: Not on file   Highest education level: Associate degree: academic program  Occupational History   Occupation: retired  Tobacco Use   Smoking status: Former    Packs/day: 1.00    Years: 15.00    Additional pack years: 0.00    Total pack years: 15.00    Types: Cigarettes    Quit date: 05/29/1998    Years since quitting: 24.4   Smokeless tobacco: Never   Tobacco comments:    I smoked off snd on from 1961 to 1999. I quit when pregnant or nursing. I wuit between 1983 and 1988  Substance and Sexual Activity   Alcohol use: Never   Drug use: Never   Sexual activity: Not Currently    Birth control/protection: None    Comment: Not active  Other Topics Concern   Not on file  Social History Narrative   Left mainly can use right   Caffeine 20 oz soda a day   Lives with x husband in a one story home   Social Determinants of Health   Financial Resource Strain: Low Risk  (10/25/2022)   Overall Financial Resource Strain (CARDIA)    Difficulty of Paying Living Expenses: Not hard at all  Food Insecurity: No Food Insecurity (10/25/2022)   Hunger Vital Sign    Worried About Running Out of Food in the Last Year: Never true    Ran Out of Food in the Last Year: Never true  Transportation Needs: No Transportation Needs (10/25/2022)   PRAPARE - Administrator, Civil Service (Medical): No    Lack of Transportation (Non-Medical): No  Physical Activity: Sufficiently Active (10/25/2022)   Exercise Vital Sign     Days of Exercise per Week: 7 days    Minutes of Exercise per Session: 30 min  Stress: No Stress Concern Present (10/25/2022)   [employer] of Occupational Health - Occupational Stress Questionnaire    Feeling of Stress : Not at all  Social Connections: Socially Isolated (10/25/2022)   Social Connection and Isolation Panel [NHANES]    Frequency of Communication with Friends and Family: More than three times a week    Frequency of Social Gatherings with Friends and Family: More than three times a week    Attends Religious Services: Never  Active Member of Clubs or Organizations: No    Attends Banker Meetings: Never    Marital Status: Widowed    Tobacco Counseling Counseling given: Not Answered Tobacco comments: I smoked off snd on from 1961 to 1999. I quit when pregnant or nursing. I wuit between 1983 and 1988   Clinical Intake:  Pre-visit preparation completed: Yes  Pain : No/denies pain  Diabetes: No  How often do you need to have someone help you when you read instructions, pamphlets, or other written materials from your doctor or pharmacy?: 1 - Never  Diabetic?No   Interpreter Needed?: No  Information entered by :: Kandis Fantasia LPN   Activities of Daily Living    10/25/2022    1:57 PM 10/24/2022    1:34 PM  In your present state of health, do you have any difficulty performing the following activities:  Hearing? 0 0  Vision? 0 0  Difficulty concentrating or making decisions? 0 0  Walking or climbing stairs? 0 0  Dressing or bathing? 0 0  Doing errands, shopping? 0 0  Preparing Food and eating ? N N  Using the Toilet? N N  In the past six months, have you accidently leaked urine? N N  Do you have problems with loss of bowel control? N N  Managing your Medications? N N  Managing your Finances? N N  Housekeeping or managing your Housekeeping? N N    Patient Care Team: Tommie Sams, DO as PCP - General (Family Medicine) Burton Apley as  Psychiatrist  Indicate any recent Medical Services you may have received from other than [facility] providers in the past year (date may be approximate).     Assessment:   This is a routine wellness examination for Ieshia.  Hearing/Vision screen Hearing Screening - Comments:: Denies hearing difficulties   Vision Screening - Comments:: Wears rx glasses - up to date with routine [facility])    Dietary issues and exercise activities discussed: Current Exercise Habits: Home exercise routine, Type of exercise: walking, Time (Minutes): 30, Frequency (Times/Week): 7, Weekly Exercise (Minutes/Week): 210, Intensity: Mild   Goals Addressed             This Visit's Progress    Remain active and independent        Depression Screen    10/25/2022    1:56 PM 02/11/2022   10:10 AM 05/31/2019   11:07 AM  PHQ 2/9 Scores  PHQ - 2 Score 0 0 0    Fall Risk    10/25/2022    1:11 PM 10/24/2022    1:34 PM 02/11/2022   10:10 AM 01/04/2022    1:13 PM 05/31/2019   11:07 AM  Fall Risk   Falls in the past year? 0 0 0 0   Number falls in past yr: 0 0 0 0 0  Injury with Fall? 0 0 0 0 0  Risk for fall due to : No Fall Risks  No Fall Risks    Follow up Falls prevention discussed;Education provided;Falls evaluation completed  Falls evaluation completed      FALL RISK PREVENTION PERTAINING TO THE HOME:  Any stairs in or around the home? No  If so, are there any without handrails? No  Home free of loose throw rugs in walkways, pet beds, electrical cords, etc? Yes  Adequate lighting in your home to reduce risk of falls? Yes   ASSISTIVE DEVICES UTILIZED TO PREVENT FALLS:  Life  alert? No  Use of a cane, walker or w/c? No  Grab bars in the bathroom? Yes  Shower chair or bench in shower? No  Elevated toilet seat or a handicapped toilet? Yes   TIMED UP AND GO:  Was the test performed? No . Telephonic visit   Cognitive Function:        10/25/2022    1:57 PM   6CIT Screen  What Year? 0 points  What month? 0 points  What time? 0 points  Count back from 20 0 points  Months in reverse 0 points  Repeat phrase 0 points  Total Score 0 points    Immunizations  There is no immunization history on file for this patient.  TDAP status: Due, Education has been provided regarding the importance of this vaccine. Advised may receive this vaccine at local pharmacy or Health Dept. Aware to provide a copy of the vaccination record if obtained from local pharmacy or Health Dept. Verbalized acceptance and understanding.  Flu Vaccine status: Declined, Education has been provided regarding the importance of this vaccine but patient still declined. Advised may receive this vaccine at local pharmacy or Health Dept. Aware to provide a copy of the vaccination record if obtained from local pharmacy or Health Dept. Verbalized acceptance and understanding.  Pneumococcal vaccine status: Declined,  Education has been provided regarding the importance of this vaccine but patient still declined. Advised may receive this vaccine at local pharmacy or Health Dept. Aware to provide a copy of the vaccination record if obtained from local pharmacy or Health Dept. Verbalized acceptance and understanding.   Covid-19 vaccine status: Declined, Education has been provided regarding the importance of this vaccine but patient still declined. Advised may receive this vaccine at local pharmacy or Health Dept.or vaccine clinic. Aware to provide a copy of the vaccination record if obtained from local pharmacy or Health Dept. Verbalized acceptance and understanding.  Qualifies for Shingles Vaccine? Yes   Zostavax completed No   Shingrix Completed?: No.    Education has been provided regarding the importance of this vaccine. Patient has been advised to call insurance company to determine out of pocket expense if they have not yet received this vaccine. Advised may also receive vaccine at local  pharmacy or Health Dept. Verbalized acceptance and understanding.  Screening Tests Health Maintenance  Topic Date Due   DTaP/Tdap/Td (1 - Tdap) Never done   COVID-19 Vaccine (1) 11/10/2022 (Originally 04/26/1951)   Zoster Vaccines- Shingrix (1 of 2) 01/24/2023 (Originally 10/26/1998)   MAMMOGRAM  02/12/2023 (Originally 10/26/1998)   COLONOSCOPY (Pts 45-50yrs Insurance coverage will need to be confirmed)  02/12/2023 (Originally 10/25/1993)   DEXA SCAN  10/25/2023 (Originally 10/25/2013)   Hepatitis C Screening  10/25/2023 (Originally 10/26/1968)   INFLUENZA VACCINE  01/30/2023   Medicare Annual Wellness (AWV)  10/25/2023   HPV VACCINES  Aged Out   Pneumonia Vaccine 38+ Years old  Discontinued    Health Maintenance  Health Maintenance Due  Topic Date Due   DTaP/Tdap/Td (1 - Tdap) Never done    Colorectal cancer status:  Patient declines   Mammogram status:  Patient declines   Bone density status: Patient declines   Lung Cancer Screening: (Low Dose CT Chest recommended if Age 64-80 years, 30 pack-year currently smoking OR have quit w/in 15years.) does not qualify.   Lung Cancer Screening Referral: n/a  Additional Screening:  Hepatitis C Screening: does qualify  Vision Screening: Recommended annual ophthalmology exams for early detection of glaucoma and other  disorders of the eye. Is the patient up to date with their annual eye exam?  Yes  Who is the provider or what is the name of the office in which the patient attends annual eye exams? [facility] [place])  If pt is not established with a provider, would they like to be referred to a provider to establish care? No .   Dental Screening: Recommended annual dental exams for proper oral hygiene  Community Resource Referral / Chronic Care Management: CRR required this visit?  No   CCM required this visit?  No      Plan:     I have personally reviewed and noted the following in the patient's chart:   Medical  and social history Use of alcohol, tobacco or illicit drugs  Current medications and supplements including opioid prescriptions. Patient is not currently taking opioid prescriptions. Functional ability and status Nutritional status Physical activity Advanced directives List of other physicians Hospitalizations, surgeries, and ER visits in previous 12 months Vitals Screenings to include cognitive, depression, and falls Referrals and appointments  In addition, I have reviewed and discussed with patient certain preventive protocols, quality metrics, and best practice recommendations. A written personalized care plan for preventive services as well as general preventive health recommendations were provided to patient.     Durwin Nora, [place]   1/61/0960   Due to this being a virtual visit, the after visit summary with patients personalized plan was offered to patient via mail or my-chart. Patient would like to access on my-chart/  Nurse Notes: No concerns

## 2022-10-29 ENCOUNTER — Ambulatory Visit (INDEPENDENT_AMBULATORY_CARE_PROVIDER_SITE_OTHER): Payer: Medicare HMO | Admitting: Family Medicine

## 2022-10-29 ENCOUNTER — Ambulatory Visit (HOSPITAL_COMMUNITY)
Admission: RE | Admit: 2022-10-29 | Discharge: 2022-10-29 | Disposition: A | Discharge status: Home / Self Care | Payer: Medicare HMO | Source: Ambulatory Visit | Attending: Family Medicine | Admitting: Family Medicine

## 2022-10-29 DIAGNOSIS — E7801 Familial hypercholesterolemia: Secondary | ICD-10-CM

## 2022-10-29 DIAGNOSIS — N1832 Chronic kidney disease, stage 3b: Secondary | ICD-10-CM | POA: Diagnosis not present

## 2022-10-29 DIAGNOSIS — M5441 Lumbago with sciatica, right side: Secondary | ICD-10-CM | POA: Diagnosis not present

## 2022-10-29 MED ORDER — NYSTATIN 100000 UNIT/GM EX POWD: 1.0000 | Freq: Every day | CUTANEOUS | 1 refills | Status: DC | PRN

## 2022-10-29 MED ORDER — BACLOFEN 10 MG PO TABS: 5.0000 mg | ORAL_TABLET | Freq: Three times a day (TID) | ORAL | 0 refills | Status: DC | PRN

## 2022-10-29 NOTE — Patient Instructions (Signed)
Labs today.  Xray at the hospital.  Medication as directed.  Take care  Dr. Adriana Simas

## 2022-10-30 ENCOUNTER — Telehealth: Payer: Self-pay

## 2022-10-30 DIAGNOSIS — M5441 Lumbago with sciatica, right side: Secondary | ICD-10-CM | POA: Insufficient documentation

## 2022-10-30 HISTORY — DX: Lumbago with sciatica, right side: M54.41

## 2022-10-30 LAB — BASIC METABOLIC PANEL
BUN/Creatinine Ratio: 15 (ref 12–28)
BUN: 27 mg/dL (ref 8–27)
CO2: 21 mmol/L (ref 20–29)
Calcium: 9.5 mg/dL (ref 8.7–10.3)
Chloride: 107 mmol/L — ABNORMAL HIGH (ref 96–106)
Creatinine, Ser: 1.83 mg/dL — ABNORMAL HIGH (ref 0.57–1.00)
Glucose: 102 mg/dL — ABNORMAL HIGH (ref 70–99)
Potassium: 5.2 mmol/L (ref 3.5–5.2)
Sodium: 144 mmol/L (ref 134–144)
eGFR: 29 mL/min/{1.73_m2} — ABNORMAL LOW (ref >59)

## 2022-10-30 LAB — LIPID PANEL
Chol/HDL Ratio: 3.7 ratio (ref 0.0–4.4)
Cholesterol, Total: 190 mg/dL (ref 100–199)
HDL: 52 mg/dL (ref >39)
LDL Chol Calc (NIH): 103 mg/dL — ABNORMAL HIGH (ref 0–99)
Triglycerides: 205 mg/dL — ABNORMAL HIGH (ref 0–149)
VLDL Cholesterol Cal: 35 mg/dL (ref 5–40)

## 2022-10-30 NOTE — Telephone Encounter (Signed)
Spoke with patient and informed of her lab results, she states would like to have recommendations on LDL levels. She states its familial, please advise.

## 2022-10-30 NOTE — Progress Notes (Signed)
Subjective:  Patient ID: Cassandra Hayden, female    DOB: [birth year]  Age: 72 y.o. MRN: [id number]  CC: Chief Complaint  Patient presents with   Back Pain    Across buttocks area down right leg for about a week. Taking some muscle relaxer that ran out     HPI:  72 year old female with the below mentioned medical problems presents for evaluation of back pain.  Patient reports a 1 week history of right-sided back pain.  She also reports radiation down right leg.  Mostly affects the lateral aspect of the leg.  She has taken some leftover muscle laxer's without resolution.  She states that she has had a prior fracture in her back.  She has had no recent imaging.  No fall, trauma, injury.  No other complaints at this time.  Patient Active Problem List   Diagnosis Date Noted   Acute right-sided low back pain with right-sided sciatica 10/30/2022   Panic attack 04/09/2022   Familial hypercholesteremia 02/11/2022   History of stroke 02/11/2022   History of seizure 02/11/2022   Bipolar disorder (HCC) 02/11/2022   CKD (chronic kidney disease), stage III (HCC) 03/13/2021   Anemia 03/13/2021   Chronic migraine without aura without status migrainosus, not intractable 05/31/2019   Gastroesophageal reflux disease without esophagitis 05/31/2019   Seasonal allergic rhinitis 05/31/2019   Health care maintenance 05/31/2019    Social Hx   Social History   Socioeconomic History   Marital status: Widowed    Spouse name: Not on file   Number of children: Not on file   Years of education: Not on file   Highest education level: Associate degree: academic program  Occupational History   Occupation: retired  Tobacco Use   Smoking status: Former    Packs/day: 1.00    Years: 15.00    Additional pack years: 0.00    Total pack years: 15.00    Types: Cigarettes    Quit date: 05/29/1998    Years since quitting: 24.4   Smokeless tobacco: Never   Tobacco comments:    I smoked off snd on from 1961 to  1999. I quit when pregnant or nursing. I wuit between 1983 and 1988  Substance and Sexual Activity   Alcohol use: Never   Drug use: Never   Sexual activity: Not Currently    Birth control/protection: None    Comment: Not active  Other Topics Concern   Not on file  Social History Narrative   Left mainly can use right   Caffeine 20 oz soda a day   Lives with x husband in a one story home   Social Determinants of Health   Financial Resource Strain: Low Risk  (10/25/2022)   Overall Financial Resource Strain (CARDIA)    Difficulty of Paying Living Expenses: Not hard at all  Food Insecurity: No Food Insecurity (10/25/2022)   Hunger Vital Sign    Worried About Running Out of Food in the Last Year: Never true    Ran Out of Food in the Last Year: Never true  Transportation Needs: No Transportation Needs (10/25/2022)   PRAPARE - Administrator, Civil Service (Medical): No    Lack of Transportation (Non-Medical): No  Physical Activity: Sufficiently Active (10/25/2022)   Exercise Vital Sign    Days of Exercise per Week: 7 days    Minutes of Exercise per Session: 30 min  Stress: No Stress Concern Present (10/25/2022)   [employer] of Occupational Health - Occupational Stress  Questionnaire    Feeling of Stress : Not at all  Social Connections: Socially Isolated (10/25/2022)   Social Connection and Isolation Panel [NHANES]    Frequency of Communication with Friends and Family: More than three times a week    Frequency of Social Gatherings with Friends and Family: More than three times a week    Attends Religious Services: Never    Database administrator or Organizations: No    Attends Banker Meetings: Never    Marital Status: Widowed    Review of Systems Per HPI  Objective:  BP 138/86   Pulse 73   Temp 97.9 F (36.6 C)   Ht 5\' 5"  (1.651 m)   Wt 142 lb (64.4 kg)   SpO2 97%   BMI 23.63 kg/m      10/29/2022    2:46 PM 10/25/2022    1:09 PM 04/08/2022     3:52 PM  BP/Weight  Systolic BP 138  409  Diastolic BP 86  106  Wt. (Lbs) 142 140 140  BMI 23.63 kg/m2 23.3 kg/m2 23.3 kg/m2    Physical Exam Vitals and nursing note reviewed.  Constitutional:      General: She is not in acute distress.    Appearance: Normal appearance.  HENT:     Head: Normocephalic and atraumatic.  Eyes:     General:        Right eye: No discharge.        Left eye: No discharge.     Conjunctiva/sclera: Conjunctivae normal.  Cardiovascular:     Rate and Rhythm: Normal rate and regular rhythm.  Pulmonary:     Effort: Pulmonary effort is normal.     Breath sounds: Normal breath sounds. No wheezing, rhonchi or rales.  Musculoskeletal:     Comments: Lumbar spine tenderness to palpation.  Neurological:     Mental Status: She is alert.     Lab Results  Component Value Date   WBC 7.8 02/11/2022   HGB 11.4 02/11/2022   HCT 34.2 02/11/2022   PLT 284 02/11/2022   GLUCOSE 102 (H) 10/29/2022   CHOL 190 10/29/2022   TRIG 205 (H) 10/29/2022   HDL 52 10/29/2022   LDLCALC 103 (H) 10/29/2022   ALT 14 02/11/2022   AST 17 02/11/2022   NA 144 10/29/2022   K 5.2 10/29/2022   CL 107 (H) 10/29/2022   CREATININE 1.83 (H) 10/29/2022   BUN 27 10/29/2022   CO2 21 10/29/2022   TSH 0.77 06/21/2019   INR 1.0 03/12/2021   HGBA1C 5.2 03/13/2021     Assessment & Plan:   Problem List Items Addressed This Visit       Nervous and Auditory   Acute right-sided low back pain with right-sided sciatica    No NSAIDs in the setting of CKD. Baclofen as directed.  X-ray for further evaluation.      Relevant Medications   zaleplon (SONATA) 10 MG capsule   baclofen (LIORESAL) 10 MG tablet   Other Relevant Orders   DG Lumbar Spine Complete (Completed)     Genitourinary   CKD (chronic kidney disease), stage III (HCC)   Relevant Orders   Basic Metabolic Panel (Completed)     Other   Familial hypercholesteremia   Relevant Orders   Lipid panel (Completed)     Meds ordered this encounter  Medications   nystatin (MYCOSTATIN/NYSTOP) powder    Sig: Apply 1 Application topically daily as needed (for rash).  Dispense:  15 g    Refill:  1   baclofen (LIORESAL) 10 MG tablet    Sig: Take 0.5-1 tablets (5-10 mg total) by mouth 3 (three) times daily as needed for muscle spasms (Back pain).    Dispense:  30 each    Refill:  0     Loriene Taunton DO [facility]

## 2022-10-30 NOTE — Assessment & Plan Note (Signed)
No NSAIDs in the setting of CKD. Baclofen as directed.  X-ray for further evaluation.

## 2022-10-31 ENCOUNTER — Other Ambulatory Visit: Payer: Self-pay

## 2022-10-31 MED ORDER — ATORVASTATIN CALCIUM 80 MG PO TABS: 80.0000 mg | ORAL_TABLET | Freq: Every day | ORAL | 1 refills | Status: DC

## 2022-10-31 NOTE — Telephone Encounter (Signed)
Patient informed per drs recommendations, prescription sent in, (atorvastatin 80 mg)

## 2022-12-16 ENCOUNTER — Telehealth: Payer: Self-pay | Admitting: Family Medicine

## 2022-12-16 NOTE — Telephone Encounter (Signed)
Patient was seen 10/29/22 and called today checking on referral to a kidney specialist . She wants to go ahead  with that appointment as soon as possible stating her kidneys are not any better. Please advise  

## 2022-12-16 NOTE — Telephone Encounter (Signed)
Patient was seen 10/29/22 and called today checking on referral to a kidney specialist . She wants to go ahead  with that appointment as soon as possible stating her kidneys are not any better. Please advise

## 2022-12-17 ENCOUNTER — Other Ambulatory Visit: Payer: Self-pay

## 2022-12-17 DIAGNOSIS — N1832 Chronic kidney disease, stage 3b: Secondary | ICD-10-CM

## 2023-01-24 ENCOUNTER — Telehealth: Payer: Self-pay

## 2023-01-24 NOTE — Telephone Encounter (Signed)
Pt is calling she is having really bad leg cramps due to the atorvastatin (LIPITOR) 80 MG tablet  pt is wanting to know id she can go back down to 40mg  she can't handle the muscle cramps.   [facility] on [address] [phone]

## 2023-01-27 ENCOUNTER — Other Ambulatory Visit: Payer: Self-pay

## 2023-01-27 MED ORDER — ATORVASTATIN CALCIUM 40 MG PO TABS: 40.0000 mg | ORAL_TABLET | Freq: Every day | ORAL | 1 refills | Status: DC

## 2023-01-27 NOTE — Telephone Encounter (Signed)
Spoke with pt and medication change and sent in 40mg  tablets.

## 2023-02-04 ENCOUNTER — Other Ambulatory Visit (HOSPITAL_COMMUNITY): Payer: Self-pay | Admitting: Nephrology

## 2023-02-04 DIAGNOSIS — E7801 Familial hypercholesterolemia: Secondary | ICD-10-CM

## 2023-02-04 DIAGNOSIS — M5441 Lumbago with sciatica, right side: Secondary | ICD-10-CM

## 2023-02-04 DIAGNOSIS — N189 Chronic kidney disease, unspecified: Secondary | ICD-10-CM

## 2023-02-04 DIAGNOSIS — D649 Anemia, unspecified: Secondary | ICD-10-CM

## 2023-02-14 ENCOUNTER — Ambulatory Visit (HOSPITAL_COMMUNITY)
Admission: RE | Admit: 2023-02-14 | Discharge: 2023-02-14 | Disposition: A | Discharge status: Home / Self Care | Payer: Medicare HMO | Source: Ambulatory Visit | Attending: Nephrology | Admitting: Nephrology

## 2023-02-14 DIAGNOSIS — M5441 Lumbago with sciatica, right side: Secondary | ICD-10-CM | POA: Insufficient documentation

## 2023-02-14 DIAGNOSIS — N189 Chronic kidney disease, unspecified: Secondary | ICD-10-CM | POA: Insufficient documentation

## 2023-02-14 DIAGNOSIS — E7801 Familial hypercholesterolemia: Secondary | ICD-10-CM | POA: Diagnosis present

## 2023-02-14 DIAGNOSIS — D649 Anemia, unspecified: Secondary | ICD-10-CM | POA: Diagnosis present

## 2023-02-21 ENCOUNTER — Ambulatory Visit (INDEPENDENT_AMBULATORY_CARE_PROVIDER_SITE_OTHER): Payer: Medicare HMO | Admitting: Family Medicine

## 2023-02-21 VITALS — BP 120/78 | HR 64 | Temp 97.2°F | Wt 150.6 lb

## 2023-02-21 DIAGNOSIS — U071 COVID-19: Secondary | ICD-10-CM | POA: Diagnosis not present

## 2023-02-21 MED ORDER — PREDNISONE 50 MG PO TABS: 50.0000 mg | ORAL_TABLET | Freq: Every day | ORAL | 0 refills | Status: AC

## 2023-02-24 ENCOUNTER — Encounter: Payer: Self-pay | Admitting: Family Medicine

## 2023-02-24 NOTE — Progress Notes (Signed)
Subjective:  Patient ID: Cassandra Hayden, female    DOB: 1951/01/04  Age: 72 y.o. MRN: [id number]  CC: COVID    HPI:  72 year old female with the below mentioned medical problems presents for evaluation of the above.  Patient has had low-grade temp and congestion.  She is also having wheezing, cough, and sore throat.  Husband has recently tested positive for COVID-19.  She tested positive last night.  Will discuss treatment options today.  Patient Active Problem List   Diagnosis Date Noted   Panic attack 04/09/2022   Familial hypercholesteremia 02/11/2022   History of stroke 02/11/2022   History of seizure 02/11/2022   Bipolar disorder (HCC) 02/11/2022   CKD (chronic kidney disease), stage III (HCC) 03/13/2021   Chronic migraine without aura without status migrainosus, not intractable 05/31/2019   Gastroesophageal reflux disease without esophagitis 05/31/2019   Seasonal allergic rhinitis 05/31/2019   Health care maintenance 05/31/2019    Social Hx   Social History   Socioeconomic History   Marital status: Widowed    Spouse name: Not on file   Number of children: Not on file   Years of education: Not on file   Highest education level: Associate degree: academic program  Occupational History   Occupation: retired  Tobacco Use   Smoking status: Former    Current packs/day: 0.00    Average packs/day: 1 pack/day for 15.0 years (15.0 ttl pk-yrs)    Types: Cigarettes    Start date: 05/30/1983    Quit date: 05/29/1998    Years since quitting: 24.7   Smokeless tobacco: Never   Tobacco comments:    I smoked off snd on from 1961 to 1999. I quit when pregnant or nursing. I wuit between 1983 and 1988  Substance and Sexual Activity   Alcohol use: Never   Drug use: Never   Sexual activity: Not Currently    Birth control/protection: None    Comment: Not active  Other Topics Concern   Not on file  Social History Narrative   Left mainly can use right   Caffeine 20 oz soda a day    Lives with x husband in a one story home   Social Determinants of Health   Financial Resource Strain: Low Risk  (10/25/2022)   Overall Financial Resource Strain (CARDIA)    Difficulty of Paying Living Expenses: Not hard at all  Food Insecurity: No Food Insecurity (10/25/2022)   Hunger Vital Sign    Worried About Running Out of Food in the Last Year: Never true    Ran Out of Food in the Last Year: Never true  Transportation Needs: No Transportation Needs (10/25/2022)   PRAPARE - Administrator, Civil Service (Medical): No    Lack of Transportation (Non-Medical): No  Physical Activity: Sufficiently Active (10/25/2022)   Exercise Vital Sign    Days of Exercise per Week: 7 days    Minutes of Exercise per Session: 30 min  Stress: No Stress Concern Present (10/25/2022)   [employer] of Occupational Health - Occupational Stress Questionnaire    Feeling of Stress : Not at all  Social Connections: Socially Isolated (10/25/2022)   Social Connection and Isolation Panel [NHANES]    Frequency of Communication with Friends and Family: More than three times a week    Frequency of Social Gatherings with Friends and Family: More than three times a week    Attends Religious Services: Never    Database administrator or Organizations: No  Attends Banker Meetings: Never    Marital Status: Widowed    Review of Systems Per HPI  Objective:  BP 120/78   Pulse 64   Temp (!) 97.2 F (36.2 C) (Temporal)   Wt 150 lb 9.6 oz (68.3 kg)   SpO2 98%   BMI 25.06 kg/m      02/21/2023   10:50 AM 10/29/2022    2:46 PM 10/25/2022    1:09 PM  BP/Weight  Systolic BP 120 138 --  Diastolic BP 78 86 --  Wt. (Lbs) 150.6 142 140  BMI 25.06 kg/m2 23.63 kg/m2 23.3 kg/m2    Physical Exam Vitals and nursing note reviewed.  Constitutional:      General: She is not in acute distress. HENT:     Head: Normocephalic and atraumatic.     Mouth/Throat:     Pharynx: Oropharynx is  clear.  Cardiovascular:     Rate and Rhythm: Normal rate and regular rhythm.  Pulmonary:     Effort: Pulmonary effort is normal.     Breath sounds: Normal breath sounds. No wheezing or rales.  Neurological:     Mental Status: She is alert.     Lab Results  Component Value Date   WBC 7.8 02/11/2022   HGB 11.4 02/11/2022   HCT 34.2 02/11/2022   PLT 284 02/11/2022   GLUCOSE 102 (H) 10/29/2022   CHOL 190 10/29/2022   TRIG 205 (H) 10/29/2022   HDL 52 10/29/2022   LDLCALC 103 (H) 10/29/2022   ALT 14 02/11/2022   AST 17 02/11/2022   NA 144 10/29/2022   K 5.2 10/29/2022   CL 107 (H) 10/29/2022   CREATININE 1.83 (H) 10/29/2022   BUN 27 10/29/2022   CO2 21 10/29/2022   TSH 0.77 06/21/2019   INR 1.0 03/12/2021   HGBA1C 5.2 03/13/2021     Assessment & Plan:   Problem List Items Addressed This Visit   None Visit Diagnoses     COVID    -  Primary      Patient has tested positive for COVID-19.  Discussed antiviral treatment.  Patient declined.  Patient states that she often benefits from corticosteroids with respiratory infections.  Placing on prednisone.  Discussed worrisome signs and symptoms and when to seek care.  Meds ordered this encounter  Medications   predniSONE (DELTASONE) 50 MG tablet    Sig: Take 1 tablet (50 mg total) by mouth daily for 5 days.    Dispense:  5 tablet    Refill:  0    Follow-up:  Return if symptoms worsen or fail to improve.  Everlene Other DO [facility]

## 2023-03-26 ENCOUNTER — Ambulatory Visit: Payer: Medicare HMO | Admitting: Family Medicine

## 2023-03-26 ENCOUNTER — Other Ambulatory Visit: Payer: Self-pay | Admitting: Family Medicine

## 2023-03-26 ENCOUNTER — Ambulatory Visit (HOSPITAL_COMMUNITY)
Admission: RE | Admit: 2023-03-26 | Discharge: 2023-03-26 | Disposition: A | Discharge status: Home / Self Care | Payer: Medicare HMO | Source: Ambulatory Visit | Attending: Family Medicine | Admitting: Family Medicine

## 2023-03-26 VITALS — BP 174/104 | HR 87 | Temp 98.8°F | Wt 151.6 lb

## 2023-03-26 DIAGNOSIS — R053 Chronic cough: Secondary | ICD-10-CM | POA: Insufficient documentation

## 2023-03-26 DIAGNOSIS — N1832 Chronic kidney disease, stage 3b: Secondary | ICD-10-CM

## 2023-03-26 DIAGNOSIS — Z87892 Personal history of anaphylaxis: Secondary | ICD-10-CM

## 2023-03-26 MED ORDER — BUDESONIDE-FORMOTEROL FUMARATE 160-4.5 MCG/ACT IN AERO: 2.0000 | INHALATION_SPRAY | Freq: Two times a day (BID) | RESPIRATORY_TRACT | 3 refills | Status: DC

## 2023-03-26 MED ORDER — LEVALBUTEROL HCL 1.25 MG/3ML IN NEBU: 1.2500 mg | INHALATION_SOLUTION | RESPIRATORY_TRACT | 0 refills | Status: AC | PRN

## 2023-03-26 NOTE — Patient Instructions (Signed)
Labs today.  Chest xray at the hospital.  Medications as directed.

## 2023-03-27 LAB — CBC
Hematocrit: 38.9 % (ref 34.0–46.6)
Hemoglobin: 12.6 g/dL (ref 11.1–15.9)
MCH: 28.2 pg (ref 26.6–33.0)
MCHC: 32.4 g/dL (ref 31.5–35.7)
MCV: 87 fL (ref 79–97)
Platelets: 386 10*3/uL (ref 150–450)
RBC: 4.47 x10E6/uL (ref 3.77–5.28)
RDW: 14 % (ref 11.7–15.4)
WBC: 8.8 10*3/uL (ref 3.4–10.8)

## 2023-03-27 LAB — BASIC METABOLIC PANEL
BUN/Creatinine Ratio: 13 (ref 12–28)
BUN: 17 mg/dL (ref 8–27)
CO2: 25 mmol/L (ref 20–29)
Calcium: 10 mg/dL (ref 8.7–10.3)
Chloride: 102 mmol/L (ref 96–106)
Creatinine, Ser: 1.35 mg/dL — ABNORMAL HIGH (ref 0.57–1.00)
Glucose: 103 mg/dL — ABNORMAL HIGH (ref 70–99)
Potassium: 5 mmol/L (ref 3.5–5.2)
Sodium: 143 mmol/L (ref 134–144)
eGFR: 41 mL/min/{1.73_m2} — ABNORMAL LOW (ref >59)

## 2023-03-29 ENCOUNTER — Emergency Department (HOSPITAL_COMMUNITY): Payer: Medicare HMO

## 2023-03-29 ENCOUNTER — Encounter (HOSPITAL_COMMUNITY): Payer: Self-pay | Admitting: *Deleted

## 2023-03-29 ENCOUNTER — Observation Stay (HOSPITAL_COMMUNITY)
Admission: EM | Admit: 2023-03-29 | Discharge: 2023-03-30 | Discharge status: Against Medical Advice | Payer: Medicare HMO | Attending: Internal Medicine | Admitting: Internal Medicine

## 2023-03-29 ENCOUNTER — Other Ambulatory Visit: Payer: Self-pay

## 2023-03-29 DIAGNOSIS — J45998 Other asthma: Secondary | ICD-10-CM | POA: Insufficient documentation

## 2023-03-29 DIAGNOSIS — Z7982 Long term (current) use of aspirin: Secondary | ICD-10-CM | POA: Insufficient documentation

## 2023-03-29 DIAGNOSIS — R55 Syncope and collapse: Secondary | ICD-10-CM | POA: Diagnosis not present

## 2023-03-29 DIAGNOSIS — F319 Bipolar disorder, unspecified: Secondary | ICD-10-CM

## 2023-03-29 DIAGNOSIS — J45909 Unspecified asthma, uncomplicated: Secondary | ICD-10-CM

## 2023-03-29 DIAGNOSIS — R0789 Other chest pain: Secondary | ICD-10-CM | POA: Diagnosis not present

## 2023-03-29 DIAGNOSIS — Z87898 Personal history of other specified conditions: Secondary | ICD-10-CM | POA: Diagnosis not present

## 2023-03-29 DIAGNOSIS — Z8673 Personal history of transient ischemic attack (TIA), and cerebral infarction without residual deficits: Secondary | ICD-10-CM

## 2023-03-29 DIAGNOSIS — Z79899 Other long term (current) drug therapy: Secondary | ICD-10-CM | POA: Diagnosis not present

## 2023-03-29 DIAGNOSIS — N1831 Chronic kidney disease, stage 3a: Secondary | ICD-10-CM

## 2023-03-29 DIAGNOSIS — E7801 Familial hypercholesterolemia: Secondary | ICD-10-CM

## 2023-03-29 DIAGNOSIS — Z87891 Personal history of nicotine dependence: Secondary | ICD-10-CM | POA: Diagnosis not present

## 2023-03-29 DIAGNOSIS — R079 Chest pain, unspecified: Secondary | ICD-10-CM

## 2023-03-29 LAB — HEPATIC FUNCTION PANEL
ALT: 13 U/L (ref 0–44)
AST: 16 U/L (ref 15–41)
Albumin: 3.4 g/dL — ABNORMAL LOW (ref 3.5–5.0)
Alkaline Phosphatase: 54 U/L (ref 38–126)
Bilirubin, Direct: 0.1 mg/dL (ref 0.0–0.2)
Indirect Bilirubin: 0.9 mg/dL (ref 0.3–0.9)
Total Bilirubin: 1 mg/dL (ref 0.3–1.2)
Total Protein: 6.5 g/dL (ref 6.5–8.1)

## 2023-03-29 LAB — CBC
HCT: 31.6 % — ABNORMAL LOW (ref 36.0–46.0)
Hemoglobin: 10.2 g/dL — ABNORMAL LOW (ref 12.0–15.0)
MCH: 28.3 pg (ref 26.0–34.0)
MCHC: 32.3 g/dL (ref 30.0–36.0)
MCV: 87.5 fL (ref 80.0–100.0)
Platelets: 272 10*3/uL (ref 150–400)
RBC: 3.61 MIL/uL — ABNORMAL LOW (ref 3.87–5.11)
RDW: 14 % (ref 11.5–15.5)
WBC: 13.3 10*3/uL — ABNORMAL HIGH (ref 4.0–10.5)
nRBC: 0 % (ref 0.0–0.2)

## 2023-03-29 LAB — MAGNESIUM: Magnesium: 2.7 mg/dL — ABNORMAL HIGH (ref 1.7–2.4)

## 2023-03-29 LAB — BASIC METABOLIC PANEL
Anion gap: 10 (ref 5–15)
BUN: 35 mg/dL — ABNORMAL HIGH (ref 8–23)
CO2: 26 mmol/L (ref 22–32)
Calcium: 8.6 mg/dL — ABNORMAL LOW (ref 8.9–10.3)
Chloride: 99 mmol/L (ref 98–111)
Creatinine, Ser: 1.79 mg/dL — ABNORMAL HIGH (ref 0.44–1.00)
GFR, Estimated: 29 mL/min — ABNORMAL LOW (ref >60)
Glucose, Bld: 137 mg/dL — ABNORMAL HIGH (ref 70–99)
Potassium: 4.6 mmol/L (ref 3.5–5.1)
Sodium: 135 mmol/L (ref 135–145)

## 2023-03-29 LAB — TROPONIN I (HIGH SENSITIVITY)
Troponin I (High Sensitivity): 3 ng/L (ref <18)
Troponin I (High Sensitivity): 3 ng/L (ref <18)

## 2023-03-29 LAB — CBG MONITORING, ED: Glucose-Capillary: 127 mg/dL — ABNORMAL HIGH (ref 70–99)

## 2023-03-29 LAB — D-DIMER, QUANTITATIVE: D-Dimer, Quant: 1.29 ug{FEU}/mL — ABNORMAL HIGH (ref 0.00–0.50)

## 2023-03-29 MED ORDER — ASPIRIN 81 MG PO TBEC: 81.0000 mg | DELAYED_RELEASE_TABLET | Freq: Every day | ORAL | Status: DC

## 2023-03-29 MED ORDER — ONDANSETRON HCL 4 MG/2ML IJ SOLN: 4.0000 mg | Freq: Four times a day (QID) | INTRAMUSCULAR | Status: DC | PRN

## 2023-03-29 MED ORDER — SODIUM CHLORIDE 0.9 % IV SOLN: INTRAVENOUS | Status: DC

## 2023-03-29 MED ORDER — ASPIRIN 81 MG PO CHEW
324.0000 mg | CHEWABLE_TABLET | Freq: Once | ORAL | Status: AC
  Administered 2023-03-29: 324 mg via ORAL
  Filled 2023-03-29: qty 4

## 2023-03-29 MED ORDER — LACTATED RINGERS IV BOLUS
1000.0000 mL | Freq: Once | INTRAVENOUS | Status: AC
  Administered 2023-03-29: 1000 mL via INTRAVENOUS

## 2023-03-29 MED ORDER — ACETAMINOPHEN 325 MG PO TABS: 650.0000 mg | ORAL_TABLET | Freq: Four times a day (QID) | ORAL | Status: DC | PRN

## 2023-03-29 MED ORDER — ALBUTEROL SULFATE (2.5 MG/3ML) 0.083% IN NEBU: 2.5000 mg | INHALATION_SOLUTION | RESPIRATORY_TRACT | Status: DC | PRN

## 2023-03-29 MED ORDER — ENOXAPARIN SODIUM 30 MG/0.3ML IJ SOSY
30.0000 mg | PREFILLED_SYRINGE | INTRAMUSCULAR | Status: DC
  Administered 2023-03-29: 30 mg via SUBCUTANEOUS
  Filled 2023-03-29: qty 0.3

## 2023-03-29 MED ORDER — TRAZODONE HCL 50 MG PO TABS: 200.0000 mg | ORAL_TABLET | Freq: Every evening | ORAL | Status: DC | PRN

## 2023-03-29 MED ORDER — PROPRANOLOL HCL ER 80 MG PO CP24
80.0000 mg | ORAL_CAPSULE | Freq: Every day | ORAL | Status: DC
  Filled 2023-03-29 (×2): qty 1

## 2023-03-29 MED ORDER — MOMETASONE FURO-FORMOTEROL FUM 200-5 MCG/ACT IN AERO
2.0000 | INHALATION_SPRAY | Freq: Two times a day (BID) | RESPIRATORY_TRACT | Status: DC
  Administered 2023-03-29: 2 via RESPIRATORY_TRACT
  Filled 2023-03-29: qty 8.8

## 2023-03-29 MED ORDER — ONDANSETRON HCL 4 MG PO TABS: 4.0000 mg | ORAL_TABLET | Freq: Four times a day (QID) | ORAL | Status: DC | PRN

## 2023-03-29 MED ORDER — LAMOTRIGINE 100 MG PO TABS: 100.0000 mg | ORAL_TABLET | Freq: Every day | ORAL | Status: DC | PRN

## 2023-03-29 MED ORDER — FENTANYL CITRATE PF 50 MCG/ML IJ SOSY
50.0000 ug | PREFILLED_SYRINGE | Freq: Once | INTRAMUSCULAR | Status: AC
  Administered 2023-03-29: 50 ug via INTRAVENOUS
  Filled 2023-03-29: qty 1

## 2023-03-29 MED ORDER — BACLOFEN 10 MG PO TABS: 5.0000 mg | ORAL_TABLET | Freq: Three times a day (TID) | ORAL | Status: DC | PRN

## 2023-03-29 MED ORDER — ACETAMINOPHEN 650 MG RE SUPP: 650.0000 mg | Freq: Four times a day (QID) | RECTAL | Status: DC | PRN

## 2023-03-29 MED ORDER — ATORVASTATIN CALCIUM 40 MG PO TABS: 40.0000 mg | ORAL_TABLET | Freq: Every day | ORAL | Status: DC

## 2023-03-29 MED ORDER — LAMOTRIGINE 100 MG PO TABS: 200.0000 mg | ORAL_TABLET | Freq: Every day | ORAL | Status: DC

## 2023-03-29 MED ORDER — PANTOPRAZOLE SODIUM 40 MG IV SOLR
40.0000 mg | Freq: Once | INTRAVENOUS | Status: AC
  Administered 2023-03-29: 40 mg via INTRAVENOUS
  Filled 2023-03-29: qty 10

## 2023-03-29 MED ORDER — PANTOPRAZOLE SODIUM 40 MG PO TBEC: 40.0000 mg | DELAYED_RELEASE_TABLET | Freq: Every day | ORAL | Status: DC

## 2023-03-29 NOTE — H&P (Addendum)
History and Physical    Patient: Cassandra Hayden FTD:[id number] DOB: 04-Mar-1951 DOA: 03/29/2023 DOS: the patient was seen and examined on 03/29/2023 PCP: Tommie Sams, DO  Patient coming from: Home  Chief Complaint:  Chief Complaint  Patient presents with   Hypotension   HPI: Cassandra Hayden is a 72 y.o. female with medical history significant of asthma, chronic kidney disease, TIA and hyperlipidemia who presented with syncope episode.   Patient has not been feeling well for about one month. She was diagnosed with COVID 19 on 02/21/23, and was treated with 5 days of prednisone 50 mg daily with some improvement in her symptoms but not going back to baseline.   She continued to have generalized weakness, malaise and cough.  On 09/25 she followed up with her primary care provider due to persistent cough, she had a chest radiograph and was told to have bronchitis, apparently pneumonia was ruled out.   She has been mainly in bed over last several days. Yesterday afternoon she had intense vomiting, multiple times. Today she felt weaker and "felt" her blood pressure being low. While getting out of the bed, she felt dizzy, lightheaded, and experienced tunneled vision, ultimately losing her consciousness. She woke up on the floor and managed to crawl to the front door of her home. She tried to stand up to open the door to call her ex husband, when she lost her consciousness again.  When patient woke up, her blood pressure systolic was in the 70s and she was transported to the ED.  She has been experiencing an exacerbation of her chronic right sided chest pain for the last few days. Mainly chest pressure with no radiation, improving or worsening factors. No frank angina.   Her blood pressure has been low over last several days with systolic in the 80 and 70's.  No fever or chills.    Review of Systems: As mentioned in the history of present illness. All other systems reviewed and are negative. 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 No pertinent surgical history. Social History:  reports that she quit smoking about 24 years ago. Her smoking use included cigarettes. She started smoking about 39 years ago. She has a 15 pack-year smoking history. She has never used smokeless tobacco. She reports that she does not drink alcohol and does not use drugs.  Allergies  Allergen Reactions   Canola Oil [Vegetable Oil] Anaphylaxis    Anaphylaxis  Anaphylaxis     Cephalosporins     Anaphylaxis     Ergotamine     anaphylaxis     Fluogen [Influenza Virus Vaccine] Anaphylaxis   Iodinated Contrast Media Anaphylaxis   Iodine     Including contrast dye - anaphylaxis  Including contrast dye - anaphylaxis  Penicillins     Anaphylaxis     Pneumovax [Pneumococcal Polysaccharide Vaccine] Anaphylaxis   Povidone-Iodine     Anaphylaxis     Quinine     Anaphylaxis  Anaphylaxis     Sulfa Antibiotics     Anaphylaxis  Anaphylaxis     Acacia     Blistering of the mouth  Blistering of the mouth     Pineapple     Blistering of the mouth  Blistering of the mouth     Yeast     Blistering of the mouth  Blistering of the mouth      Family History  Problem Relation Age of Onset   Cancer Mother    Hyperlipidemia Mother    Hypertension Mother    Arthritis Mother    Heart disease Father    Mental illness Father    Alcohol  abuse Father    Parkinsonism Maternal Aunt    Migraines Granddaughter    Heart attack Son    Hyperlipidemia Maternal Grandmother    Stroke Maternal Grandmother    Early death Paternal Grandmother     Prior to Admission medications   Medication Sig Start Date End Date Taking? Authorizing Provider  albuterol (VENTOLIN HFA) 108 (90 Base) MCG/ACT inhaler INHALE 2 PUFFS INTO THE LUNGS EVERY 6 HOURS AS NEEDED FOR WHEEZING 03/27/23   Tommie Sams, DO  aspirin EC 81 MG tablet Take 81 mg by mouth daily. Swallow whole.    [provider]  atorvastatin (LIPITOR) 40 MG tablet Take 1 tablet (40 mg total) by mouth daily. 01/27/23   Tommie Sams, DO  B Complex-C (SUPER B COMPLEX PO) Take 1 tablet by mouth daily.    [provider]  baclofen (LIORESAL) 10 MG tablet Take 0.5-1 tablets (5-10 mg total) by mouth 3 (three) times daily as needed for muscle spasms (Back pain). 10/29/22   Tommie Sams, DO  budesonide-formoterol (SYMBICORT) 160-4.5 MCG/ACT inhaler Inhale 2 puffs into the lungs 2 (two) times daily. 03/26/23   Tommie Sams, DO  Cholecalciferol (D-3-5) 125 MCG (5000 UT) capsule Take 5,000 Units by mouth daily.    [provider]  EPINEPHrine (EPIPEN 2-PAK IJ) Inject 0.3 mg as directed once as needed (allergic reaction).    [provider]  lamoTRIgine (LAMICTAL) 100 MG tablet Take 100 mg by mouth daily as needed (for seizure activity).    [provider]  levalbuterol Pauline Aus) 1.25 MG/3ML nebulizer solution Take 1.25 mg by nebulization every 4 (four) hours as needed for wheezing. 03/26/23 06/24/23  Everlene Other G, DO  LORATADINE PO Take 10 mg by mouth daily.    [provider]  magnesium gluconate (MAGONATE) 500 MG tablet Take 500 mg by mouth 2 (two) times daily.    [provider]  Multiple Vitamins-Minerals (MULTIVITAMIN WITH MINERALS) tablet Take 1 tablet by mouth daily.    [provider]  nystatin (MYCOSTATIN/NYSTOP) powder  Apply 1 Application topically daily as needed (for rash). 10/29/22   Tommie Sams, DO  omeprazole (PRILOSEC) 20 MG capsule Take 20 mg by mouth daily.    [provider]  OVER THE COUNTER MEDICATION COQ 10    [provider]  propranolol ER (INDERAL LA) 80 MG 24 hr capsule Take 80 mg by mouth daily. 01/15/21   [provider]  traZODone (DESYREL) 50 MG tablet Take 200 mg by mouth at bedtime as needed for sleep. 01/15/21   [provider]  vitamin E  400 UNIT capsule Take 400 Units by mouth daily.    [provider]  zaleplon (SONATA) 10 MG capsule Take 10 mg by mouth at bedtime. 08/31/22   [provider]    Physical Exam: Vitals:   03/29/23 1715 03/29/23 1717 03/29/23 1730 03/29/23 1745  BP:  113/61 113/62 108/71  Pulse: 60  60 62  Resp: (!) 23 11 10  (!) 9  Temp:      TempSrc:      SpO2: 94%  96%   Weight:      Height:       Neurology awake and alert ENT with mild pallor Cardiovascular with S1 and S2 present and regular with no gallops, rubs or murmurs No JVD No lower extremity edema Respiratory with no rales or wheezing, no rhonchi Abdomen with no distention  Reproducible chest pain at chest wall palpation right upper region.  Data Reviewed:   Na 135, K 4,6 Cl 99, bicarbonate 26, glucose 137, bun 35, cr 1,79  Mg 2.7 AST 16, ALT 13, Total Bilirubin 1,0  High sensitive troponin 3 and 3  Wbc 13.3 hgb 10,2 plt 272  D dimer 1,29   CT head with no acute intracranial abnormalities. Moderate chronic small vessel ischemic changes within the cerebral white matter.  CT cervical spine with no acute spinal fracture. Mild grade 1 anterolisthesis at C2-C3. C3-C4 and C4-C5. Non specific straightening of the expected cervical lordosis.  6 mm ground glass pulmonary nodule within the right lung apex. (Needs follow up in 6 months).   CT chest and abdomen with no acute findings in the chest, abdomen or pelvis.   Chest radiograph with right  rotation with no cardiomegaly, no infiltrates or effusions, mild atelectasis at bases.   EKG 74 bpm, normal axis, normal intervals, sinus rhythm with no significant ST segment or T wave changes.   Assessment and Plan: * Syncope Clinically compatible with orthostatic syncope due to hypovolemia related to severe vomiting.  At home she has been hypotensive with systolic blood pressure in the 70's  Plan to continue volume resuscitation with normal saline at 100 ml per hr. Follow up orthostatics in am.  She has a elevated d dimer possible related to recent COVID infection. Will complete work up with pulmonary V/Q scan and lower extremity doppler ultrasounds.  Continue telemetry monitoring. Hold Propranolol for now.  CKD stage 3a, GFR 45-59 ml/min (HCC) AKI, pre renal renal failure.   Plan to continue volume resuscitation with isotonic saline.  Patient with no further nausea or vomiting.  Follow up renal function and electrolytes in am.   History of stroke Continue aspirin, statin and blood pressure monitoring.  No clinical signs of acute CVA. Neuro checks per unit protocol.   History of seizure Continue with lamotrigine.  No clinical signs of active seizures.   Bipolar disorder [facility]) Patient with no psychotropic medications, plan to follow up as outpatient.   Familial hypercholesteremia Continue statin therapy.   Asthma, chronic No acute exacerbation, continue with bronchodilator therapy and inhaled corticosteroids.     Advance Care Planning:   Code Status: Full Code   Consults: none   Family Communication: I spoke with patient's ex husband at the bedside, we talked in detail about patient's condition, plan of care and prognosis and all questions were addressed.   Severity of Illness: The appropriate patient status for this patient is OBSERVATION. Observation status is judged to be reasonable and necessary in order to provide the required intensity of service  to ensure the  patient's safety. The patient's presenting symptoms, physical exam findings, and initial radiographic and laboratory data in the context of their medical condition is felt to place them at decreased risk for further clinical deterioration. Furthermore, it is anticipated that the patient will be medically stable for discharge from the hospital within 2 midnights of admission.   Author: Coralie Keens, MD 03/29/2023 6:04 PM  For on call review [email or web address].

## 2023-03-29 NOTE — Progress Notes (Signed)
Staff nurse called ED for pt report. Advised by ED nurse that pt may possibly be discharged home, so no report at this time.

## 2023-03-29 NOTE — ED Provider Notes (Signed)
[facility] AT [facility] Provider Note   CSN: [id number] Arrival date & time: 03/29/23  1412     History  Chief Complaint  Patient presents with   Hypotension    Lejla Moeser is a 72 y.o. female.  HPI Patient presents for syncope.  Medical history includes migraines, GERD, CKD, HLD, bipolar disorder, CVA, seizures, anemia, asthma.  She was seen by PCP 2 days ago for chronic cough.  Lab work at the time was unremarkable.  X-ray imaging from 3 days ago shows possible bronchitis.  Yesterday, she had large amounts of vomiting.  She describes the vomiting as dark brown in color.  She had some abdominal pain but states that this is a chronic condition for her.  She has been able to tolerate only small sips of fluids since yesterday.  Today, she had 2 episodes of syncope.  She describes prodrome as tunnel vision formation.  At time of syncope, she did have a fall and did strike her head.  She attempted to check her blood pressure at home with a wrist cuff.  Initially, it would not read.  When it did, she had SBP's in the 70s.  She continues to feel lightheaded.  Significant other, at bedside, states that she appears more pale than normal.  In addition to her syncopal episode today, she has had a chest pressure, described as an elephant sitting on her chest.  She states that she had a heart attack in 2005.  She states that this was treated medically and did occur in the setting of heat and physical exhaustion.  She does not currently follow with a cardiologist.  Her son did have an MI in his 30s.  She does not smoke.  She is not on any blood pressure medications.   Home Medications Prior to Admission medications   Medication Sig Start Date End Date Taking? Authorizing Provider  albuterol (VENTOLIN HFA) 108 (90 Base) MCG/ACT inhaler INHALE 2 PUFFS INTO THE LUNGS EVERY 6 HOURS AS NEEDED FOR WHEEZING 03/27/23   Tommie Sams, DO  aspirin EC 81 MG tablet Take 81 mg by mouth  daily. Swallow whole.    [provider]  atorvastatin (LIPITOR) 40 MG tablet Take 1 tablet (40 mg total) by mouth daily. 01/27/23   Tommie Sams, DO  B Complex-C (SUPER B COMPLEX PO) Take 1 tablet by mouth daily.    [provider]  baclofen (LIORESAL) 10 MG tablet Take 0.5-1 tablets (5-10 mg total) by mouth 3 (three) times daily as needed for muscle spasms (Back pain). 10/29/22   Tommie Sams, DO  budesonide-formoterol (SYMBICORT) 160-4.5 MCG/ACT inhaler Inhale 2 puffs into the lungs 2 (two) times daily. 03/26/23   Tommie Sams, DO  Cholecalciferol (D-3-5) 125 MCG (5000 UT) capsule Take 5,000 Units by mouth daily.    [provider]  EPINEPHrine (EPIPEN 2-PAK IJ) Inject 0.3 mg as directed once as needed (allergic reaction).    [provider]  lamoTRIgine (LAMICTAL) 100 MG tablet Take 100 mg by mouth daily as needed (for seizure activity).    [provider]  levalbuterol Pauline Aus) 1.25 MG/3ML nebulizer solution Take 1.25 mg by nebulization every 4 (four) hours as needed for wheezing. 03/26/23 06/24/23  Everlene Other G, DO  LORATADINE PO Take 10 mg by mouth daily.    [provider]  magnesium gluconate (MAGONATE) 500 MG tablet Take 500 mg by mouth 2 (two) times daily.    [provider]  Multiple Vitamins-Minerals (MULTIVITAMIN WITH MINERALS) tablet Take 1 tablet by mouth daily.    [provider]  nystatin (MYCOSTATIN/NYSTOP) powder Apply 1 Application topically daily as needed (for rash). 10/29/22   Tommie Sams, DO  omeprazole (PRILOSEC) 20 MG capsule Take 20 mg by mouth daily.    [provider]  OVER THE COUNTER MEDICATION COQ 10    [provider]  propranolol ER (INDERAL LA) 80 MG 24 hr capsule Take 80 mg by mouth daily. 01/15/21   [provider]  traZODone (DESYREL) 50 MG tablet Take 200 mg by mouth at bedtime as needed for sleep. 01/15/21   [provider]  vitamin E 400 UNIT capsule  Take 400 Units by mouth daily.    [provider]  zaleplon (SONATA) 10 MG capsule Take 10 mg by mouth at bedtime. 08/31/22   [provider]      Allergies    Canola oil [vegetable oil], Cephalosporins, Ergotamine, Fluogen [influenza virus vaccine], Iodinated contrast media, Iodine, Penicillins, Pneumovax [pneumococcal polysaccharide vaccine], Povidone-iodine, Quinine, Sulfa antibiotics, Acacia, Pineapple, and Yeast    Review of Systems   Review of Systems  Constitutional:  Positive for activity change and appetite change.  Respiratory:  Positive for chest tightness and shortness of breath.   Cardiovascular:  Positive for chest pain.  Gastrointestinal:  Positive for abdominal pain, nausea and vomiting.  Neurological:  Positive for syncope and light-headedness.  All other systems reviewed and are negative.   Physical Exam Updated Vital Signs BP (!) 106/58   Pulse 66   Temp 98.3 F (36.8 C) (Oral)   Resp 12   Ht 5\' 5"  (1.651 m)   Wt 56.7 kg   SpO2 93%   BMI 20.80 kg/m  Physical Exam Vitals and nursing note reviewed.  Constitutional:      General: She is not in acute distress.    Appearance: Normal appearance. She is well-developed. She is not ill-appearing, toxic-appearing or diaphoretic.  HENT:     Head: Normocephalic and atraumatic.     Right Ear: External ear normal.     Left Ear: External ear normal.     Nose: Nose normal.     Mouth/Throat:     Mouth: Mucous membranes are moist.  Eyes:     Extraocular Movements: Extraocular movements intact.     Conjunctiva/sclera: Conjunctivae normal.  Cardiovascular:     Rate and Rhythm: Normal rate and regular rhythm.     Heart sounds: No murmur heard. Pulmonary:     Effort: Pulmonary effort is normal. No respiratory distress.     Breath sounds: Normal breath sounds. No wheezing or rales.  Chest:     Chest wall: Tenderness present.  Abdominal:     General: There is no distension.     Palpations: Abdomen is  soft.     Tenderness: There is abdominal tenderness.  Musculoskeletal:        General: No swelling. Normal range of motion.     Cervical back: Normal range of motion and neck supple.     Right lower leg: No edema.     Left lower leg: No edema.  Skin:    General: Skin is warm and dry.     Coloration: Skin is pale. Skin is not jaundiced.  Neurological:     General: No focal deficit present.     Mental Status: She is alert and oriented to person, place, and time.  Psychiatric:  Mood and Affect: Mood normal.        Behavior: Behavior normal.     ED Results / Procedures / Treatments   Labs (all labs ordered are listed, but only abnormal results are displayed) Labs Reviewed  BASIC METABOLIC PANEL - Abnormal; Notable for the following components:      Result Value   Glucose, Bld 137 (*)    BUN 35 (*)    Creatinine, Ser 1.79 (*)    Calcium 8.6 (*)    GFR, Estimated 29 (*)    All other components within normal limits  CBC - Abnormal; Notable for the following components:   WBC 13.3 (*)    RBC 3.61 (*)    Hemoglobin 10.2 (*)    HCT 31.6 (*)    All other components within normal limits  MAGNESIUM - Abnormal; Notable for the following components:   Magnesium 2.7 (*)    All other components within normal limits  HEPATIC FUNCTION PANEL - Abnormal; Notable for the following components:   Albumin 3.4 (*)    All other components within normal limits  D-DIMER, QUANTITATIVE - Abnormal; Notable for the following components:   D-Dimer, Quant 1.29 (*)    All other components within normal limits  CBG MONITORING, ED - Abnormal; Notable for the following components:   Glucose-Capillary 127 (*)    All other components within normal limits  TROPONIN I (HIGH SENSITIVITY)  TROPONIN I (HIGH SENSITIVITY)    EKG EKG Interpretation Date/Time:  Saturday March 29 2023 14:26:39 EDT Ventricular Rate:  74 PR Interval:  180 QRS Duration:  78 QT Interval:  434 QTC Calculation: 481 R  Axis:   -13  Text Interpretation: Normal sinus rhythm Inferior infarct , age undetermined Confirmed by Gloris Manchester [phone]) on 03/29/2023 2:45:04 PM  Radiology CT Head Wo Contrast  Result Date: 03/29/2023 CLINICAL DATA:  Provided history: Head trauma, GCS=15, loss of consciousness. Emesis. Neck trauma. Neck pain. EXAM: CT HEAD WITHOUT CONTRAST CT CERVICAL SPINE WITHOUT CONTRAST TECHNIQUE: Multidetector CT imaging of the head and cervical spine was performed following the standard protocol without intravenous contrast. Multiplanar CT image reconstructions of the cervical spine were also generated. RADIATION DOSE REDUCTION: This exam was performed according to the departmental dose-optimization program which includes automated exposure control, adjustment of the mA and/or kV according to patient size and/or use of iterative reconstruction technique. COMPARISON:  Brain MRI 03/13/2021.  Head CT 03/12/2021. FINDINGS: CT HEAD FINDINGS Brain: No age advanced or lobar predominant parenchymal atrophy. Patchy and ill-defined hypoattenuation within the cerebral white matter, nonspecific but compatible with moderate chronic small vessel ischemic disease. There is no acute intracranial hemorrhage. No demarcated cortical infarct. No extra-axial fluid collection. No evidence of an intracranial mass. No midline shift. Vascular: No hyperdense vessel.  Atherosclerotic calcifications. Skull: No calvarial fracture or aggressive osseous lesion. Sinuses/Orbits: No mass or acute finding within the imaged orbits. No significant paranasal sinus disease at the imaged levels. CT CERVICAL SPINE FINDINGS Alignment: Mild dextrocurvature of the cervical spine. Nonspecific straightening of the expected cervical lordosis. Slight C2-C3 grade 1 anterolisthesis. 2 mm grade 1 anterolisthesis at C3-C4 and C4-C5. Slight C7-T1 grade 1 anterolisthesis. Skull base and vertebrae: The basion-dental and atlanto-dental intervals are maintained.No evidence of  acute fracture to the cervical spine. Incidentally noted persistent ossiculum terminale. Soft tissues and spinal canal: No prevertebral fluid or swelling. No visible canal hematoma. Disc levels: Cervical spondylosis with multilevel disc space narrowing, disc bulges/central disc protrusions, posterior disc osteophyte complexes, uncovertebral  hypertrophy and facet arthrosis. Disc space narrowing is greatest at C5-C6 and C6-C7 (advanced at these levels). No appreciable high-grade spinal canal stenosis. Multilevel bony neural foraminal narrowing. Upper chest: 6 mm ground-glass pulmonary nodule within the right lung apex (series 4, image 81). No visible pneumothorax. IMPRESSION: CT head: 1. No evidence of an acute intracranial abnormality. 2. Moderate chronic small vessel ischemic changes within the cerebral white matter. CT cervical spine: 1. No evidence of an acute cervical spine fracture. 2. Mild grade 1 anterolisthesis at C2-C3, C3-C4 and C4-C5. 3. Nonspecific straightening of the expected cervical lordosis. 4. Mild dextrocurvature of the cervical spine. 5. Cervical spondylosis as described. 6. 6 mm ground-glass pulmonary nodule within the right lung apex. Initial follow-up with CT at 6 months is recommended to confirm persistence. If persistent, repeat CT is recommended every 2 years until 5 years of stability has been established. This recommendation follows the consensus statement: Guidelines for Management of Incidental Pulmonary Nodules Detected on CT Images: From the Fleischner Society 2017; Radiology 2017; 284:228-243. Electronically Signed   By: Jackey Loge D.O.   On: 03/29/2023 16:16   CT Cervical Spine Wo Contrast  Result Date: 03/29/2023 CLINICAL DATA:  Provided history: Head trauma, GCS=15, loss of consciousness. Emesis. Neck trauma. Neck pain. EXAM: CT HEAD WITHOUT CONTRAST CT CERVICAL SPINE WITHOUT CONTRAST TECHNIQUE: Multidetector CT imaging of the head and cervical spine was performed following  the standard protocol without intravenous contrast. Multiplanar CT image reconstructions of the cervical spine were also generated. RADIATION DOSE REDUCTION: This exam was performed according to the departmental dose-optimization program which includes automated exposure control, adjustment of the mA and/or kV according to patient size and/or use of iterative reconstruction technique. COMPARISON:  Brain MRI 03/13/2021.  Head CT 03/12/2021. FINDINGS: CT HEAD FINDINGS Brain: No age advanced or lobar predominant parenchymal atrophy. Patchy and ill-defined hypoattenuation within the cerebral white matter, nonspecific but compatible with moderate chronic small vessel ischemic disease. There is no acute intracranial hemorrhage. No demarcated cortical infarct. No extra-axial fluid collection. No evidence of an intracranial mass. No midline shift. Vascular: No hyperdense vessel.  Atherosclerotic calcifications. Skull: No calvarial fracture or aggressive osseous lesion. Sinuses/Orbits: No mass or acute finding within the imaged orbits. No significant paranasal sinus disease at the imaged levels. CT CERVICAL SPINE FINDINGS Alignment: Mild dextrocurvature of the cervical spine. Nonspecific straightening of the expected cervical lordosis. Slight C2-C3 grade 1 anterolisthesis. 2 mm grade 1 anterolisthesis at C3-C4 and C4-C5. Slight C7-T1 grade 1 anterolisthesis. Skull base and vertebrae: The basion-dental and atlanto-dental intervals are maintained.No evidence of acute fracture to the cervical spine. Incidentally noted persistent ossiculum terminale. Soft tissues and spinal canal: No prevertebral fluid or swelling. No visible canal hematoma. Disc levels: Cervical spondylosis with multilevel disc space narrowing, disc bulges/central disc protrusions, posterior disc osteophyte complexes, uncovertebral hypertrophy and facet arthrosis. Disc space narrowing is greatest at C5-C6 and C6-C7 (advanced at these levels). No appreciable  high-grade spinal canal stenosis. Multilevel bony neural foraminal narrowing. Upper chest: 6 mm ground-glass pulmonary nodule within the right lung apex (series 4, image 81). No visible pneumothorax. IMPRESSION: CT head: 1. No evidence of an acute intracranial abnormality. 2. Moderate chronic small vessel ischemic changes within the cerebral white matter. CT cervical spine: 1. No evidence of an acute cervical spine fracture. 2. Mild grade 1 anterolisthesis at C2-C3, C3-C4 and C4-C5. 3. Nonspecific straightening of the expected cervical lordosis. 4. Mild dextrocurvature of the cervical spine. 5. Cervical spondylosis as described. 6. 6 mm  ground-glass pulmonary nodule within the right lung apex. Initial follow-up with CT at 6 months is recommended to confirm persistence. If persistent, repeat CT is recommended every 2 years until 5 years of stability has been established. This recommendation follows the consensus statement: Guidelines for Management of Incidental Pulmonary Nodules Detected on CT Images: From the Fleischner Society 2017; Radiology 2017; 284:228-243. Electronically Signed   By: Jackey Loge D.O.   On: 03/29/2023 16:16   DG Chest Portable 1 View  Result Date: 03/29/2023 CLINICAL DATA:  Low blood pressure. Sacral episode today. Chest heaviness. EXAM: PORTABLE CHEST 1 VIEW COMPARISON:  Radiographs 03/26/2023. FINDINGS: 1435 hours. Lordotic positioning. Allowing for this, the heart size and mediastinal contours are stable with aortic tortuosity. The lungs are clear. There is no pleural effusion or pneumothorax. No acute osseous findings are evident. Telemetry leads overlie the chest. IMPRESSION: No evidence of active cardiopulmonary process. Electronically Signed   By: Carey Bullocks M.D.   On: 03/29/2023 15:08    Procedures Procedures    Medications Ordered in ED Medications  pantoprazole (PROTONIX) injection 40 mg (40 mg Intravenous Given 03/29/23 1459)  aspirin chewable tablet 324 mg (324 mg  Oral Given 03/29/23 1514)  fentaNYL (SUBLIMAZE) injection 50 mcg (50 mcg Intravenous Given 03/29/23 1619)  lactated ringers bolus 1,000 mL (1,000 mLs Intravenous New Bag/Given 03/29/23 1505)    ED Course/ Medical Decision Making/ A&P                                 Medical Decision Making Amount and/or Complexity of Data Reviewed Labs: ordered. Radiology: ordered.  Risk OTC drugs. Prescription drug management.   This patient presents to the ED for concern of syncope and chest pain, this involves an extensive number of treatment options, and is a complaint that carries with it a high risk of complications and morbidity.  The differential diagnosis includes dehydration, anemia, metabolic derangements, arrhythmia, ACS, PE   Co morbidities that complicate the patient evaluation  migraines, GERD, CKD, HLD, bipolar disorder, CVA, seizures, anemia, asthma   Additional history obtained:  Additional history obtained from patient's significant other External records from outside source obtained and reviewed including EMR   Lab Tests:  I Ordered, and personally interpreted labs.  The pertinent results include: Slight drop in hemoglobin from baseline, leukocytosis, mildly elevated D-dimer, remaining lab work pending at time of signout.   Imaging Studies ordered:  I ordered imaging studies including chest x-ray, CT head, CT cervical spine I independently visualized and interpreted imaging which showed no acute findings on x-ray.  CT imaging pending at time of signout. I agree with the radiologist interpretation   Cardiac Monitoring: / EKG:  The patient was maintained on a cardiac monitor.  I personally viewed and interpreted the cardiac monitored which showed an underlying rhythm of: Sinus rhythm   Problem List / ED Course / Critical interventions / Medication management  Patient presenting for multiple complaints.  Among these are vomiting all day yesterday, p.o. intolerance,  chest pain and syncope today.  On arrival, blood pressure is low in the range of 79/47.  She states that her baseline is in the 90s SBP.  She is not on any blood pressure medications.  Vital signs are otherwise normal.  Patient endorses some lightheadedness and ongoing chest pressure.  EKG shows normal sinus rhythm without ST segment elevations.  Patient was treated with ASA and fentanyl for her chest  pain.  IV fluids were ordered for hydration, given her fluid losses and poor p.o. tolerance.  Protonix was ordered for report of brown emesis.  Lab work is notable for slight drop in hemoglobin from baseline, leukocytosis, and mildly elevated D-dimer.  Creatinine is baseline.  Troponin is normal.  At time of signout, CT imaging was pending.  Care of patient was signed out to oncoming ED provider. I ordered medication including IV fluids for hydration; ASA and fentanyl for chest pain; Protonix for dark emesis in patient with history of PUD Reevaluation of the patient after these medicines showed that the patient improved I have reviewed the patients home medicines and have made adjustments as needed   Social Determinants of Health:  Lives independently        Final Clinical Impression(s) / ED Diagnoses Final diagnoses:  Syncope and collapse  Chest pain, unspecified type    Rx / DC Orders ED Discharge Orders     None         Gloris Manchester, MD 03/29/23 1621

## 2023-03-29 NOTE — ED Notes (Signed)
Pt upset regarding admission Pt wants to go home Agreed to let this RN do orthostatics and talk to MD

## 2023-03-29 NOTE — Assessment & Plan Note (Signed)
No acute exacerbation, continue with bronchodilator therapy and inhaled corticosteroids.

## 2023-03-29 NOTE — Assessment & Plan Note (Signed)
Continue with lamotrigine.  No clinical signs of active seizures.

## 2023-03-29 NOTE — ED Notes (Addendum)
Pt came in with a low systolic BP in the 70s.  Lactated ringers given via IV in the right ac at 1505 BP now at 113/62 at 1734 Pain rated 0/10

## 2023-03-29 NOTE — ED Notes (Addendum)
Introduced self to pt  Pt stated that she has pain 6/10 in the center of her chest, non radiating for 2 days Stated that she has been feeling weak and passed out x2 today Pt stated she hit head on the back of the floor, denies thinner, HA and dizziness.   Called pharmacy to refill ASA, held fentanyl due to BP  Bolus prior to fentanyl

## 2023-03-29 NOTE — Assessment & Plan Note (Addendum)
Continue aspirin, statin and blood pressure monitoring.  No clinical signs of acute CVA. Neuro checks per unit protocol.

## 2023-03-29 NOTE — ED Provider Notes (Addendum)
  Physical Exam  BP 113/61   Pulse 60   Temp 98.3 F (36.8 C) (Oral)   Resp 11   Ht 5\' 5"  (1.651 m)   Wt 56.7 kg   SpO2 94%   BMI 20.80 kg/m   Physical Exam Vitals and nursing note reviewed.  HENT:     Head: Normocephalic and atraumatic.  Eyes:     Pupils: Pupils are equal, round, and reactive to light.  Cardiovascular:     Rate and Rhythm: Normal rate and regular rhythm.  Pulmonary:     Effort: Pulmonary effort is normal.     Breath sounds: Normal breath sounds.  Abdominal:     Palpations: Abdomen is soft.     Tenderness: There is no abdominal tenderness.  Skin:    General: Skin is warm and dry.  Neurological:     Mental Status: She is alert.  Psychiatric:        Mood and Affect: Mood normal.     Procedures  Procedures  ED Course / MDM    Medical Decision Making Amount and/or Complexity of Data Reviewed Labs: ordered. Radiology: ordered.  Risk OTC drugs. Prescription drug management.   Jackquline Bosch DO, have assumed care of this patient from the previous provider.  72 year old femalePresenting after 2 syncopal episodes today.  Medical history significant for CVA, seizures, anemia CKD.  No acute traumatic findings.  No findings on chest abdomen pelvis CT that would explain syncopal episode.  CTA of the chest was not obtained due to severe contrast allergy.  Patient may require VQ scan during admission.  Initial troponin of 3.  Low suspicion for ACS.  Informed patient of ED workup findings.  And plan for admission given need for further evaluation of high risk syncope.CTA of the chest was not obtained due to severe contrast allergy.  Patient may require VQ scan during admission.   Discussed admitting hospitalist accepts patient for admission  Final diagnosis Syncope Chest pain        Royanne Foots, DO 03/29/23 1727    Royanne Foots, DO 03/29/23 1729

## 2023-03-29 NOTE — Assessment & Plan Note (Signed)
Patient with no psychotropic medications, plan to follow up as outpatient.

## 2023-03-29 NOTE — ED Triage Notes (Signed)
Pt with low BP, states SBP in 70's.  Pt states she passed out x 2 today and with chest heaviness. + hard to take a deep breath.  Emesis yesterday.

## 2023-03-29 NOTE — Assessment & Plan Note (Signed)
AKI, pre renal renal failure.   Plan to continue volume resuscitation with isotonic saline.  Patient with no further nausea or vomiting.  Follow up renal function and electrolytes in am.

## 2023-03-29 NOTE — Assessment & Plan Note (Signed)
Continue statin therapy.

## 2023-03-29 NOTE — Assessment & Plan Note (Addendum)
Clinically compatible with orthostatic syncope due to hypovolemia related to severe vomiting.  At home she has been hypotensive with systolic blood pressure in the 70's  Plan to continue volume resuscitation with normal saline at 100 ml per hr. Follow up orthostatics in am.  She has a elevated d dimer possible related to recent COVID infection. Will complete work up with pulmonary V/Q scan and lower extremity doppler ultrasounds.  Continue telemetry monitoring. Hold Propranolol for now.

## 2023-03-29 NOTE — Progress Notes (Signed)
PHARMACIST - PHYSICIAN COMMUNICATION  CONCERNING:  Enoxaparin (Lovenox) for DVT Prophylaxis    RECOMMENDATION: Patient was prescribed enoxaprin 40mg  q24 hours for VTE prophylaxis.   Filed Weights   03/29/23 1423  Weight: 56.7 kg (125 lb)    Body mass index is 20.8 kg/m.  Estimated Creatinine Clearance: 24.7 mL/min (A) (by C-G formula based on SCr of 1.79 mg/dL (H)).  Patient is candidate for enoxaparin 30mg  every 24 hours based on CrCl <66ml/min or Weight <45kg  DESCRIPTION: Pharmacy has adjusted enoxaparin dose per [facility] policy.  Patient is now receiving enoxaparin 30 mg every 24 hours    Merryl Hacker, PharmD Clinical Pharmacist  03/29/2023 7:47 PM

## 2023-03-30 ENCOUNTER — Ambulatory Visit (HOSPITAL_COMMUNITY): Admission: RE | Admit: 2023-03-30 | Payer: Medicare HMO | Source: Ambulatory Visit

## 2023-03-30 ENCOUNTER — Encounter: Payer: Self-pay | Admitting: Family Medicine

## 2023-03-30 DIAGNOSIS — R55 Syncope and collapse: Secondary | ICD-10-CM | POA: Diagnosis not present

## 2023-03-30 DIAGNOSIS — R053 Chronic cough: Secondary | ICD-10-CM | POA: Insufficient documentation

## 2023-03-30 NOTE — Assessment & Plan Note (Signed)
Chest x-ray was obtained.  Independent interpretation: No infiltrate noted. Xopenex refilled.  Placing on Symbicort.

## 2023-03-30 NOTE — Progress Notes (Signed)
Subjective:  Patient ID: Cassandra Hayden, female    DOB: 04/26/1951  Age: 72 y.o. MRN: [id number]  CC: Cough   HPI:  72 year old female presents for evaluation of the above.  Patient is recently had COVID-19.  She reports that she continues to have ongoing wheezing, cough, and shortness of breath.  She states that she had a brief improvement with corticosteroids but then her symptoms returned again.  Patient also reports that 2 days ago she had a fever, Tmax 103.  Had associated body aches and headaches.  Currently afebrile.  Patient Active Problem List   Diagnosis Date Noted   Chronic cough 03/30/2023   Syncope 03/29/2023   Asthma, chronic 03/29/2023   Panic attack 04/09/2022   Familial hypercholesteremia 02/11/2022   History of stroke 02/11/2022   History of seizure 02/11/2022   Bipolar disorder (HCC) 02/11/2022   CKD stage 3a, GFR 45-59 ml/min (HCC) 03/13/2021   Chronic migraine without aura without status migrainosus, not intractable 05/31/2019   Gastroesophageal reflux disease without esophagitis 05/31/2019   Seasonal allergic rhinitis 05/31/2019   Health care maintenance 05/31/2019    Social Hx   Social History   Socioeconomic History   Marital status: Widowed    Spouse name: Not on file   Number of children: Not on file   Years of education: Not on file   Highest education level: Associate degree: academic program  Occupational History   Occupation: retired  Tobacco Use   Smoking status: Former    Current packs/day: 0.00    Average packs/day: 1 pack/day for 15.0 years (15.0 ttl pk-yrs)    Types: Cigarettes    Start date: 05/30/1983    Quit date: 05/29/1998    Years since quitting: 24.8   Smokeless tobacco: Never   Tobacco comments:    I smoked off snd on from 1961 to 1999. I quit when pregnant or nursing. I wuit between 1983 and 1988  Substance and Sexual Activity   Alcohol use: Never   Drug use: Never   Sexual activity: Not Currently    Birth  control/protection: None    Comment: Not active  Other Topics Concern   Not on file  Social History Narrative   Left mainly can use right   Caffeine 20 oz soda a day   Lives with x husband in a one story home   Social Determinants of Health   Financial Resource Strain: Low Risk  (10/25/2022)   Overall Financial Resource Strain (CARDIA)    Difficulty of Paying Living Expenses: Not hard at all  Food Insecurity: No Food Insecurity (03/29/2023)   Hunger Vital Sign    Worried About Running Out of Food in the Last Year: Never true    Ran Out of Food in the Last Year: Never true  Transportation Needs: No Transportation Needs (03/29/2023)   PRAPARE - Administrator, Civil Service (Medical): No    Lack of Transportation (Non-Medical): No  Physical Activity: Sufficiently Active (10/25/2022)   Exercise Vital Sign    Days of Exercise per Week: 7 days    Minutes of Exercise per Session: 30 min  Stress: No Stress Concern Present (10/25/2022)   [employer] of Occupational Health - Occupational Stress Questionnaire    Feeling of Stress : Not at all  Social Connections: Socially Isolated (10/25/2022)   Social Connection and Isolation Panel [NHANES]    Frequency of Communication with Friends and Family: More than three times a week    Frequency  of Social Gatherings with Friends and Family: More than three times a week    Attends Religious Services: Never    Database administrator or Organizations: No    Attends Banker Meetings: Never    Marital Status: Widowed    Review of Systems Per HPI  Objective:  BP (!) 174/104   Pulse 87   Temp 98.8 F (37.1 C) (Oral)   Wt 151 lb 9.6 oz (68.8 kg)   SpO2 97%   BMI 25.23 kg/m      03/30/2023    3:57 AM 03/29/2023    8:42 PM 03/29/2023    6:45 PM  BP/Weight  Systolic BP 176 160 140  Diastolic BP 110 99 81    Physical Exam Vitals and nursing note reviewed.  Constitutional:      General: She is not in acute  distress.    Appearance: Normal appearance.  HENT:     Head: Normocephalic and atraumatic.  Cardiovascular:     Rate and Rhythm: Normal rate and regular rhythm.  Pulmonary:     Effort: Pulmonary effort is normal.     Breath sounds: Wheezing present.  Neurological:     Mental Status: She is alert.     Lab Results  Component Value Date   WBC 13.3 (H) 03/29/2023   HGB 10.2 (L) 03/29/2023   HCT 31.6 (L) 03/29/2023   PLT 272 03/29/2023   GLUCOSE 137 (H) 03/29/2023   CHOL 190 10/29/2022   TRIG 205 (H) 10/29/2022   HDL 52 10/29/2022   LDLCALC 103 (H) 10/29/2022   ALT 13 03/29/2023   AST 16 03/29/2023   NA 135 03/29/2023   K 4.6 03/29/2023   CL 99 03/29/2023   CREATININE 1.79 (H) 03/29/2023   BUN 35 (H) 03/29/2023   CO2 26 03/29/2023   TSH 0.77 06/21/2019   INR 1.0 03/12/2021   HGBA1C 5.2 03/13/2021     Assessment & Plan:   Problem List Items Addressed This Visit       Genitourinary   CKD stage 3a, GFR 45-59 ml/min (HCC)     Other   Chronic cough - Primary    Chest x-ray was obtained.  Independent interpretation: No infiltrate noted. Xopenex refilled.  Placing on Symbicort.      Relevant Orders   DG Chest 2 View (Completed)   Other Visit Diagnoses     History of anaphylaxis       Relevant Medications   levalbuterol (XOPENEX) 1.25 MG/3ML nebulizer solution       Meds ordered this encounter  Medications   levalbuterol (XOPENEX) 1.25 MG/3ML nebulizer solution    Sig: Take 1.25 mg by nebulization every 4 (four) hours as needed for wheezing.    Dispense:  72 mL    Refill:  0   budesonide-formoterol (SYMBICORT) 160-4.5 MCG/ACT inhaler    Sig: Inhale 2 puffs into the lungs 2 (two) times daily.    Dispense:  1 each    Refill:  3    Follow-up:  Return if symptoms worsen or fail to improve.  Everlene Other DO [facility]

## 2023-03-30 NOTE — Plan of Care (Signed)
  Problem: Education: Goal: Knowledge of General Education information will improve Description Including pain rating scale, medication(s)/side effects and non-pharmacologic comfort measures Outcome: Progressing   

## 2023-03-30 NOTE — Hospital Course (Signed)
Per HPI: HPI: Cassandra Hayden is a 72 y.o. female with medical history significant of asthma, chronic kidney disease, TIA and hyperlipidemia who presented with syncope episode.    Patient has not been feeling well for about one month. She was diagnosed with COVID 19 on 02/21/23, and was treated with 5 days of prednisone 50 mg daily with some improvement in her symptoms but not going back to baseline.    She continued to have generalized weakness, malaise and cough.  On 09/25 she followed up with her primary care provider due to persistent cough, she had a chest radiograph and was told to have bronchitis, apparently pneumonia was ruled out.    She has been mainly in bed over last several days. Yesterday afternoon she had intense vomiting, multiple times. Today she felt weaker and "felt" her blood pressure being low. While getting out of the bed, she felt dizzy, lightheaded, and experienced tunneled vision, ultimately losing her consciousness. She woke up on the floor and managed to crawl to the front door of her home. She tried to stand up to open the door to call her ex husband, when she lost her consciousness again.  When patient woke up, her blood pressure systolic was in the 70s and she was transported to the ED.   She has been experiencing an exacerbation of her chronic right sided chest pain for the last few days. Mainly chest pressure with no radiation, improving or worsening factors. No frank angina.    Her blood pressure has been low over last several days with systolic in the 80 and 70's.  No fever or chills.    Patient left AMA prior to being able to be examined or any discussion held.  Last set of vitals taken 0357 on 03/30/23 showed that BP improved to 176/110 from admission BP of 79/47.  Presumably orthostasis improved.

## 2023-03-30 NOTE — Discharge Summary (Signed)
Physician Discharge Summary   Cassandra Hayden EXB:[id number] DOB: Jun 28, 1951 DOA: 03/29/2023  PCP: Tommie Sams, DO  Admit date: 03/29/2023 Discharge date: 03/30/2023 PATIENT LEFT AMA  CODE STATUS: Full Diet recommendation:  Diet Orders (From admission, onward)     Start     Ordered   03/29/23 1929  Diet regular Room service appropriate? Yes; Fluid consistency: Thin  Diet effective now       Question Answer Comment  Room service appropriate? Yes   Fluid consistency: Thin      03/29/23 1928            Hospital Course: Per HPI: HPI: Cassandra Hayden is a 71 y.o. female with medical history significant of asthma, chronic kidney disease, TIA and hyperlipidemia who presented with syncope episode.    Patient has not been feeling well for about one month. She was diagnosed with COVID 19 on 02/21/23, and was treated with 5 days of prednisone 50 mg daily with some improvement in her symptoms but not going back to baseline.    She continued to have generalized weakness, malaise and cough.  On 09/25 she followed up with her primary care provider due to persistent cough, she had a chest radiograph and was told to have bronchitis, apparently pneumonia was ruled out.    She has been mainly in bed over last several days. Yesterday afternoon she had intense vomiting, multiple times. Today she felt weaker and "felt" her blood pressure being low. While getting out of the bed, she felt dizzy, lightheaded, and experienced tunneled vision, ultimately losing her consciousness. She woke up on the floor and managed to crawl to the front door of her home. She tried to stand up to open the door to call her ex husband, when she lost her consciousness again.  When patient woke up, her blood pressure systolic was in the 70s and she was transported to the ED.   She has been experiencing an exacerbation of her chronic right sided chest pain for the last few days. Mainly chest pressure with no radiation, improving or  worsening factors. No frank angina.    Her blood pressure has been low over last several days with systolic in the 80 and 70's.  No fever or chills.    Patient left AMA prior to being able to be examined or any discussion held.  Last set of vitals taken 0357 on 03/30/23 showed that BP improved to 176/110 from admission BP of 79/47.  Presumably orthostasis improved.    Principal Diagnosis: Syncope  Discharge Diagnoses: Active Hospital Problems   Diagnosis Date Noted   Syncope 03/29/2023    Priority: 1.   CKD stage 3a, GFR 45-59 ml/min (HCC) 03/13/2021    Priority: 2.   History of stroke 02/11/2022    Priority: 3.   History of seizure 02/11/2022    Priority: 4.   Bipolar disorder (HCC) 02/11/2022    Priority: 5.   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   Asthma, chronic 03/29/2023    Priority: 7.    Resolved Hospital Problems  No resolved problems to display.      Allergies as of 03/30/2023       Reactions   Canola Oil [vegetable Oil] Anaphylaxis   Anaphylaxis  Anaphylaxis    Cephalosporins    Anaphylaxis    Ergotamine    anaphylaxis    Fluogen [influenza Virus Vaccine] Anaphylaxis   Iodinated Contrast Media Anaphylaxis   Iodine    Including contrast  dye - anaphylaxis  Including contrast dye - anaphylaxis    Penicillins    Anaphylaxis    Pneumovax [pneumococcal Polysaccharide Vaccine] Anaphylaxis   Povidone-iodine    Anaphylaxis    Quinine    Anaphylaxis  Anaphylaxis    Sulfa Antibiotics    Anaphylaxis  Anaphylaxis    Acacia    Blistering of the mouth  Blistering of the mouth    Beef-derived Products    Pineapple    Blistering of the mouth  Blistering of the mouth    Yeast    Blistering of the mouth  Blistering of the mouth         Medication List     ASK your doctor about these medications    albuterol 108 (90 Base) MCG/ACT inhaler Commonly known as: VENTOLIN HFA INHALE 2 PUFFS INTO THE LUNGS EVERY 6 HOURS AS NEEDED FOR  WHEEZING   aspirin EC 81 MG tablet Take 81 mg by mouth daily. Swallow whole.   atorvastatin 40 MG tablet Commonly known as: LIPITOR Take 1 tablet (40 mg total) by mouth daily.   baclofen 10 MG tablet Commonly known as: LIORESAL Take 0.5-1 tablets (5-10 mg total) by mouth 3 (three) times daily as needed for muscle spasms (Back pain).   budesonide-formoterol 160-4.5 MCG/ACT inhaler Commonly known as: SYMBICORT Inhale 2 puffs into the lungs 2 (two) times daily.   D-3-5 125 MCG (5000 UT) capsule Generic drug: Cholecalciferol Take 5,000 Units by mouth daily.   EPIPEN 2-PAK IJ Inject 0.3 mg as directed once as needed (allergic reaction).   lamoTRIgine 100 MG tablet Commonly known as: LAMICTAL Take 100 mg by mouth daily as needed (for seizure activity).   levalbuterol 1.25 MG/3ML nebulizer solution Commonly known as: XOPENEX Take 1.25 mg by nebulization every 4 (four) hours as needed for wheezing.   LORATADINE PO Take 10 mg by mouth daily.   magnesium gluconate 500 MG tablet Commonly known as: MAGONATE Take 500 mg by mouth 2 (two) times daily.   multivitamin with minerals tablet Take 1 tablet by mouth daily.   nystatin powder Commonly known as: MYCOSTATIN/NYSTOP Apply 1 Application topically daily as needed (for rash).   omeprazole 20 MG capsule Commonly known as: PRILOSEC Take 20 mg by mouth daily.   OVER THE COUNTER MEDICATION COQ 10   propranolol ER 80 MG 24 hr capsule Commonly known as: INDERAL LA Take 80 mg by mouth daily.   SUPER B COMPLEX PO Take 1 tablet by mouth daily.   traZODone 50 MG tablet Commonly known as: DESYREL Take 200 mg by mouth at bedtime as needed for sleep.   vitamin E 180 MG (400 UNITS) capsule Take 400 Units by mouth daily.   zaleplon 10 MG capsule Commonly known as: SONATA Take 10 mg by mouth at bedtime.        Allergies  Allergen Reactions   Canola Oil [Vegetable Oil] Anaphylaxis    Anaphylaxis  Anaphylaxis      Cephalosporins     Anaphylaxis     Ergotamine     anaphylaxis     Fluogen [Influenza Virus Vaccine] Anaphylaxis   Iodinated Contrast Media Anaphylaxis   Iodine     Including contrast dye - anaphylaxis  Including contrast dye - anaphylaxis     Penicillins     Anaphylaxis     Pneumovax [Pneumococcal Polysaccharide Vaccine] Anaphylaxis   Povidone-Iodine     Anaphylaxis     Quinine     Anaphylaxis  Anaphylaxis  Sulfa Antibiotics     Anaphylaxis  Anaphylaxis     Acacia     Blistering of the mouth  Blistering of the mouth     Beef-Derived Products    Pineapple     Blistering of the mouth  Blistering of the mouth     Yeast     Blistering of the mouth  Blistering of the mouth       Discharge Exam: BP (!) 176/110 (BP Location: Left Arm)   Pulse 76   Temp 98.8 F (37.1 C) (Oral)   Resp 17   Ht 5\' 5"  (1.651 m)   Wt 56.7 kg   SpO2 98%   BMI 20.80 kg/m  Not performed as patient left AMA prior to exam  The results of significant diagnostics from this hospitalization (including imaging, microbiology, ancillary and laboratory) are listed below for reference.   Microbiology: No results found for this or any previous visit (from the past 240 hour(s)).   Labs: BNP (last 3 results) No results for input(s): "BNP" in the last 8760 hours. Basic Metabolic Panel: Recent Labs  Lab 03/26/23 1430 03/29/23 1439 03/29/23 1446  NA 143 135  --   K 5.0 4.6  --   CL 102 99  --   CO2 25 26  --   GLUCOSE 103* 137*  --   BUN 17 35*  --   CREATININE 1.35* 1.79*  --   CALCIUM 10.0 8.6*  --   MG  --   --  2.7*   Liver Function Tests: Recent Labs  Lab 03/29/23 1445  AST 16  ALT 13  ALKPHOS 54  BILITOT 1.0  PROT 6.5  ALBUMIN 3.4*   No results for input(s): "LIPASE", "AMYLASE" in the last 168 hours. No results for input(s): "AMMONIA" in the last 168 hours. CBC: Recent Labs  Lab 03/26/23 1430 03/29/23 1439  WBC 8.8 13.3*  HGB 12.6 10.2*  HCT 38.9 31.6*  MCV  87 87.5  PLT 386 272   Cardiac Enzymes: No results for input(s): "CKTOTAL", "CKMB", "CKMBINDEX", "TROPONINI" in the last 168 hours. BNP: Invalid input(s): "POCBNP" CBG: Recent Labs  Lab 03/29/23 1426  GLUCAP 127*   D-Dimer Recent Labs    03/29/23 1445  DDIMER 1.29*   Hgb A1c No results for input(s): "HGBA1C" in the last 72 hours. Lipid Profile No results for input(s): "CHOL", "HDL", "LDLCALC", "TRIG", "CHOLHDL", "LDLDIRECT" in the last 72 hours. Thyroid function studies No results for input(s): "TSH", "T4TOTAL", "T3FREE", "THYROIDAB" in the last 72 hours.  Invalid input(s): "FREET3" Anemia work up No results for input(s): "VITAMINB12", "FOLATE", "FERRITIN", "TIBC", "IRON", "RETICCTPCT" in the last 72 hours. Urinalysis    Component Value Date/Time   COLORURINE YELLOW 03/13/2021 0226   APPEARANCEUR HAZY (A) 03/13/2021 0226   LABSPEC 1.009 03/13/2021 0226   PHURINE 8.0 03/13/2021 0226   GLUCOSEU NEGATIVE 03/13/2021 0226   HGBUR NEGATIVE 03/13/2021 0226   BILIRUBINUR NEGATIVE 03/13/2021 0226   KETONESUR NEGATIVE 03/13/2021 0226   PROTEINUR NEGATIVE 03/13/2021 0226   NITRITE NEGATIVE 03/13/2021 0226   LEUKOCYTESUR MODERATE (A) 03/13/2021 0226   Sepsis Labs Recent Labs  Lab 03/26/23 1430 03/29/23 1439  WBC 8.8 13.3*   Microbiology No results found for this or any previous visit (from the past 240 hour(s)).  Procedures/Studies: CT CHEST ABDOMEN PELVIS WO CONTRAST  Result Date: 03/29/2023 CLINICAL DATA:  Syncope with chest heaviness. Vomiting. Weight loss. EXAM: CT CHEST, ABDOMEN AND PELVIS WITHOUT CONTRAST TECHNIQUE: Multidetector CT imaging of  the chest, abdomen and pelvis was performed following the standard protocol without IV contrast. RADIATION DOSE REDUCTION: This exam was performed according to the departmental dose-optimization program which includes automated exposure control, adjustment of the mA and/or kV according to patient size and/or use of iterative  reconstruction technique. COMPARISON:  None Available. FINDINGS: CT CHEST FINDINGS Cardiovascular: The heart size is normal. No substantial pericardial effusion. Coronary artery calcification is evident. Mild atherosclerotic calcification is noted in the wall of the thoracic aorta. Mediastinum/Nodes: No mediastinal lymphadenopathy. No evidence for gross hilar lymphadenopathy although assessment is limited by the lack of intravenous contrast on the current study. Mild circumferential wall thickening noted mid and distal esophagus. There is no axillary lymphadenopathy. Lungs/Pleura: Subsegmental atelectasis is seen in the right middle and lower lobes. No suspicious pulmonary nodule or mass. No focal airspace consolidation. There is no evidence of pleural effusion. Musculoskeletal: No worrisome lytic or sclerotic osseous abnormality. CT ABDOMEN PELVIS FINDINGS Hepatobiliary: No suspicious focal abnormality in the liver on this study without intravenous contrast. There is no evidence for gallstones, gallbladder wall thickening, or pericholecystic fluid. No intrahepatic or extrahepatic biliary dilation. Pancreas: No focal mass lesion. No dilatation of the main duct. No intraparenchymal cyst. No peripancreatic edema. Spleen: No splenomegaly. No suspicious focal mass lesion. Adrenals/Urinary Tract: No adrenal nodule or mass. Kidneys unremarkable. No evidence for hydroureter. The urinary bladder appears normal for the degree of distention. Stomach/Bowel: Small hiatal hernia. Stomach otherwise unremarkable. Duodenum is normally positioned as is the ligament of Treitz. No small bowel wall thickening. No small bowel dilatation. The terminal ileum is normal. The appendix is normal. No gross colonic mass. No colonic wall thickening. Diverticular changes are noted in the left colon without evidence of diverticulitis. Vascular/Lymphatic: There is moderate atherosclerotic calcification of the abdominal aorta without aneurysm. There  is no gastrohepatic or hepatoduodenal ligament lymphadenopathy. No retroperitoneal or mesenteric lymphadenopathy. No pelvic sidewall lymphadenopathy. Reproductive: Unremarkable. Other: No intraperitoneal free fluid. Musculoskeletal: No worrisome lytic or sclerotic osseous abnormality. Spondylolisthesis noted L5-S1. IMPRESSION: 1. No acute findings in the chest, abdomen, or pelvis. Specifically, no findings to explain the patient's history of syncope and weight loss. 2. Mild circumferential wall thickening in the mid and distal esophagus. Esophagitis could have this appearance. 3. Small hiatal hernia. 4. Left colonic diverticulosis without diverticulitis. 5.  Aortic Atherosclerosis (ICD10-I70.0). Electronically Signed   By: Kennith Center M.D.   On: 03/29/2023 16:24   CT Head Wo Contrast  Result Date: 03/29/2023 CLINICAL DATA:  Provided history: Head trauma, GCS=15, loss of consciousness. Emesis. Neck trauma. Neck pain. EXAM: CT HEAD WITHOUT CONTRAST CT CERVICAL SPINE WITHOUT CONTRAST TECHNIQUE: Multidetector CT imaging of the head and cervical spine was performed following the standard protocol without intravenous contrast. Multiplanar CT image reconstructions of the cervical spine were also generated. RADIATION DOSE REDUCTION: This exam was performed according to the departmental dose-optimization program which includes automated exposure control, adjustment of the mA and/or kV according to patient size and/or use of iterative reconstruction technique. COMPARISON:  Brain MRI 03/13/2021.  Head CT 03/12/2021. FINDINGS: CT HEAD FINDINGS Brain: No age advanced or lobar predominant parenchymal atrophy. Patchy and ill-defined hypoattenuation within the cerebral white matter, nonspecific but compatible with moderate chronic small vessel ischemic disease. There is no acute intracranial hemorrhage. No demarcated cortical infarct. No extra-axial fluid collection. No evidence of an intracranial mass. No midline shift.  Vascular: No hyperdense vessel.  Atherosclerotic calcifications. Skull: No calvarial fracture or aggressive osseous lesion. Sinuses/Orbits: No mass or acute  finding within the imaged orbits. No significant paranasal sinus disease at the imaged levels. CT CERVICAL SPINE FINDINGS Alignment: Mild dextrocurvature of the cervical spine. Nonspecific straightening of the expected cervical lordosis. Slight C2-C3 grade 1 anterolisthesis. 2 mm grade 1 anterolisthesis at C3-C4 and C4-C5. Slight C7-T1 grade 1 anterolisthesis. Skull base and vertebrae: The basion-dental and atlanto-dental intervals are maintained.No evidence of acute fracture to the cervical spine. Incidentally noted persistent ossiculum terminale. Soft tissues and spinal canal: No prevertebral fluid or swelling. No visible canal hematoma. Disc levels: Cervical spondylosis with multilevel disc space narrowing, disc bulges/central disc protrusions, posterior disc osteophyte complexes, uncovertebral hypertrophy and facet arthrosis. Disc space narrowing is greatest at C5-C6 and C6-C7 (advanced at these levels). No appreciable high-grade spinal canal stenosis. Multilevel bony neural foraminal narrowing. Upper chest: 6 mm ground-glass pulmonary nodule within the right lung apex (series 4, image 81). No visible pneumothorax. IMPRESSION: CT head: 1. No evidence of an acute intracranial abnormality. 2. Moderate chronic small vessel ischemic changes within the cerebral white matter. CT cervical spine: 1. No evidence of an acute cervical spine fracture. 2. Mild grade 1 anterolisthesis at C2-C3, C3-C4 and C4-C5. 3. Nonspecific straightening of the expected cervical lordosis. 4. Mild dextrocurvature of the cervical spine. 5. Cervical spondylosis as described. 6. 6 mm ground-glass pulmonary nodule within the right lung apex. Initial follow-up with CT at 6 months is recommended to confirm persistence. If persistent, repeat CT is recommended every 2 years until 5 years of  stability has been established. This recommendation follows the consensus statement: Guidelines for Management of Incidental Pulmonary Nodules Detected on CT Images: From the Fleischner Society 2017; Radiology 2017; 284:228-243. Electronically Signed   By: Jackey Loge D.O.   On: 03/29/2023 16:16   CT Cervical Spine Wo Contrast  Result Date: 03/29/2023 CLINICAL DATA:  Provided history: Head trauma, GCS=15, loss of consciousness. Emesis. Neck trauma. Neck pain. EXAM: CT HEAD WITHOUT CONTRAST CT CERVICAL SPINE WITHOUT CONTRAST TECHNIQUE: Multidetector CT imaging of the head and cervical spine was performed following the standard protocol without intravenous contrast. Multiplanar CT image reconstructions of the cervical spine were also generated. RADIATION DOSE REDUCTION: This exam was performed according to the departmental dose-optimization program which includes automated exposure control, adjustment of the mA and/or kV according to patient size and/or use of iterative reconstruction technique. COMPARISON:  Brain MRI 03/13/2021.  Head CT 03/12/2021. FINDINGS: CT HEAD FINDINGS Brain: No age advanced or lobar predominant parenchymal atrophy. Patchy and ill-defined hypoattenuation within the cerebral white matter, nonspecific but compatible with moderate chronic small vessel ischemic disease. There is no acute intracranial hemorrhage. No demarcated cortical infarct. No extra-axial fluid collection. No evidence of an intracranial mass. No midline shift. Vascular: No hyperdense vessel.  Atherosclerotic calcifications. Skull: No calvarial fracture or aggressive osseous lesion. Sinuses/Orbits: No mass or acute finding within the imaged orbits. No significant paranasal sinus disease at the imaged levels. CT CERVICAL SPINE FINDINGS Alignment: Mild dextrocurvature of the cervical spine. Nonspecific straightening of the expected cervical lordosis. Slight C2-C3 grade 1 anterolisthesis. 2 mm grade 1 anterolisthesis at C3-C4  and C4-C5. Slight C7-T1 grade 1 anterolisthesis. Skull base and vertebrae: The basion-dental and atlanto-dental intervals are maintained.No evidence of acute fracture to the cervical spine. Incidentally noted persistent ossiculum terminale. Soft tissues and spinal canal: No prevertebral fluid or swelling. No visible canal hematoma. Disc levels: Cervical spondylosis with multilevel disc space narrowing, disc bulges/central disc protrusions, posterior disc osteophyte complexes, uncovertebral hypertrophy and facet arthrosis. Disc space narrowing is  greatest at C5-C6 and C6-C7 (advanced at these levels). No appreciable high-grade spinal canal stenosis. Multilevel bony neural foraminal narrowing. Upper chest: 6 mm ground-glass pulmonary nodule within the right lung apex (series 4, image 81). No visible pneumothorax. IMPRESSION: CT head: 1. No evidence of an acute intracranial abnormality. 2. Moderate chronic small vessel ischemic changes within the cerebral white matter. CT cervical spine: 1. No evidence of an acute cervical spine fracture. 2. Mild grade 1 anterolisthesis at C2-C3, C3-C4 and C4-C5. 3. Nonspecific straightening of the expected cervical lordosis. 4. Mild dextrocurvature of the cervical spine. 5. Cervical spondylosis as described. 6. 6 mm ground-glass pulmonary nodule within the right lung apex. Initial follow-up with CT at 6 months is recommended to confirm persistence. If persistent, repeat CT is recommended every 2 years until 5 years of stability has been established. This recommendation follows the consensus statement: Guidelines for Management of Incidental Pulmonary Nodules Detected on CT Images: From the Fleischner Society 2017; Radiology 2017; 284:228-243. Electronically Signed   By: Jackey Loge D.O.   On: 03/29/2023 16:16   DG Chest Portable 1 View  Result Date: 03/29/2023 CLINICAL DATA:  Low blood pressure. Sacral episode today. Chest heaviness. EXAM: PORTABLE CHEST 1 VIEW COMPARISON:   Radiographs 03/26/2023. FINDINGS: 1435 hours. Lordotic positioning. Allowing for this, the heart size and mediastinal contours are stable with aortic tortuosity. The lungs are clear. There is no pleural effusion or pneumothorax. No acute osseous findings are evident. Telemetry leads overlie the chest. IMPRESSION: No evidence of active cardiopulmonary process. Electronically Signed   By: Carey Bullocks M.D.   On: 03/29/2023 15:08   DG Chest 2 View  Result Date: 03/26/2023 CLINICAL DATA:  Persistent cough over the last several days. EXAM: CHEST - 2 VIEW COMPARISON:  None Available. FINDINGS: Heart size is normal. Mild atherosclerotic change of the thoracic aorta is noted. There is bronchial thickening but no infiltrate, collapse or effusion. No abnormal bone finding. IMPRESSION: Possible bronchitis. No consolidation, collapse or other active disease. Electronically Signed   By: Paulina Fusi M.D.   On: 03/26/2023 16:39     Time coordinating discharge: Over 30 minutes    Lewie Chamber, MD  [facility] 03/30/2023, 11:23 AM

## 2023-04-01 ENCOUNTER — Telehealth: Payer: Self-pay

## 2023-04-01 NOTE — Transitions of Care (Post Inpatient/ED Visit) (Unsigned)
04/01/2023  Name: Cassandra Hayden MRN: [id number] DOB: December 20, 1950  Today's TOC FU Call Status: Today's TOC FU Call Status:: Unsuccessful Call (1st Attempt) Unsuccessful Call (1st Attempt) Date: 04/01/23  Attempted to reach the patient regarding the most recent Inpatient/ED visit.  Follow Up Plan: Additional outreach attempts will be made to reach the patient to complete the Transitions of Care (Post Inpatient/ED visit) call.   Signature Karena Addison, LPN [facility] Nurse Health Advisor Direct Dial [phone]

## 2023-04-02 ENCOUNTER — Telehealth: Payer: Self-pay | Admitting: Family Medicine

## 2023-04-02 ENCOUNTER — Other Ambulatory Visit: Payer: Self-pay | Admitting: Family Medicine

## 2023-04-02 MED ORDER — DOXYCYCLINE HYCLATE 100 MG PO TABS: 100.0000 mg | ORAL_TABLET | Freq: Two times a day (BID) | ORAL | 0 refills | Status: DC

## 2023-04-02 NOTE — Telephone Encounter (Signed)
Patient is requesting antibiotic to be called into [facility]

## 2023-04-02 NOTE — Transitions of Care (Post Inpatient/ED Visit) (Unsigned)
04/02/2023  Name: Cassandra Hayden MRN: [id number] DOB: May 26, 1951  Today's TOC FU Call Status: Today's TOC FU Call Status:: Unsuccessful Call (2nd Attempt) Unsuccessful Call (1st Attempt) Date: 04/01/23 Unsuccessful Call (2nd Attempt) Date: 04/02/23  Attempted to reach the patient regarding the most recent Inpatient/ED visit.  Follow Up Plan: Additional outreach attempts will be made to reach the patient to complete the Transitions of Care (Post Inpatient/ED visit) call.   Signature Karena Addison, LPN [facility] Nurse Health Advisor Direct Dial [phone]

## 2023-04-03 NOTE — Transitions of Care (Post Inpatient/ED Visit) (Signed)
04/03/2023  Name: Cassandra Hayden MRN: [id number] DOB: 1951-05-24  Today's TOC FU Call Status: Today's TOC FU Call Status:: Unsuccessful Call (3rd Attempt) Unsuccessful Call (1st Attempt) Date: 04/01/23 Unsuccessful Call (2nd Attempt) Date: 04/02/23 Unsuccessful Call (3rd Attempt) Date: 04/03/23  Attempted to reach the patient regarding the most recent Inpatient/ED visit.  Follow Up Plan: No further outreach attempts will be made at this time. We have been unable to contact the patient.  Signature Karena Addison, LPN [facility] Nurse Health Advisor Direct Dial [phone]

## 2023-05-06 ENCOUNTER — Telehealth: Payer: Self-pay | Admitting: Family Medicine

## 2023-05-06 MED ORDER — BUDESONIDE-FORMOTEROL FUMARATE 160-4.5 MCG/ACT IN AERO: 2.0000 | INHALATION_SPRAY | Freq: Two times a day (BID) | RESPIRATORY_TRACT | 3 refills | Status: DC

## 2023-05-06 NOTE — Telephone Encounter (Signed)
Refill on  budesonide-formoterol (SYMBICORT) 160-4.5 MCG/ACT inhaler  [facility]

## 2023-05-06 NOTE — Telephone Encounter (Signed)
Script sent to pharmacy.

## 2023-05-08 ENCOUNTER — Ambulatory Visit: Payer: Self-pay | Admitting: Family Medicine

## 2023-05-08 NOTE — Telephone Encounter (Signed)
Copied from CRM [id number]. Topic: Clinical - Red Word Triage >> May 08, 2023  1:48 PM Eunice Blase wrote: Red Word that prompted transfer to Nurse Triage: Pt aspirated yesterday morning and is now coughing non stop. Having trouble breathing.CRM [phone]

## 2023-05-08 NOTE — Telephone Encounter (Signed)
  Chief Complaint: Cough and trouble breathing Symptoms: shortness of breath worsening on exertion; thick mucus when coughing Frequency: constant Pertinent Negatives: Patient denies fever or chest pain Disposition: [x] ED /[] Urgent Care (no appt availability in office) / [] Appointment(In office/virtual)/ []  [facility]/ [] Home Care/ [] Refused Recommended Disposition /[] [facility]/ []  Follow-up with PCP Additional Notes: Patient reports trouble breathing after episode of vomiting while laying down. Patient states that she aspirated. Pt sounded very winded in interview, and expressed dizziness and increased shortness of breath when walking to the bathroom.    Reason for Disposition  Patient sounds very sick or weak to the triager  Continuous (nonstop) coughing interferes with work, school, or sleeping  Answer Assessment - Initial Assessment Questions 1. RESPIRATORY STATUS: "Describe your breathing?" (e.g., wheezing, shortness of breath, unable to speak, severe coughing)      Coughing, shortness of breath, wheezing 2. ONSET: "When did this breathing problem begin?"      Last night, vomited stomich acid and aspirated 3. PATTERN "Does the difficult breathing come and go, or has it been constant since it started?"      Constant 4. SEVERITY: "How bad is your breathing?" (e.g., mild, moderate, severe)    - MILD: No SOB at rest, mild SOB with walking, speaks normally in sentences, can lie down, no retractions, pulse < 100.    - MODERATE: SOB at rest, SOB with minimal exertion and prefers to sit, cannot lie down flat, speaks in phrases, mild retractions, audible wheezing, pulse 100-120.    - SEVERE: Very SOB at rest, speaks in single words, struggling to breathe, sitting hunched forward, retractions, pulse > 120      Moderate, gets winded walking to bathroom 5. RECURRENT SYMPTOM: "Have you had difficulty breathing before?" If Yes, ask: "When was the last time?" and "What  happened that time?"      No; asthma 6. CARDIAC HISTORY: "Do you have any history of heart disease?" (e.g., heart attack, angina, bypass surgery, angioplasty)      No 7. LUNG HISTORY: "Do you have any history of lung disease?"  (e.g., pulmonary embolus, asthma, emphysema)     asthma 8. CAUSE: "What do you think is causing the breathing problem?"      Laid down while throwing 9. OTHER SYMPTOMS: "Do you have any other symptoms? (e.g., dizziness, runny nose, cough, chest pain, fever)     Dizziness when walking 10. O2 SATURATION MONITOR:  "Do you use an oxygen saturation monitor (pulse oximeter) at home?" If Yes, ask: "What is your reading (oxygen level) today?" "What is your usual oxygen saturation reading?" (e.g., 95%)       no 11. PREGNANCY: "Is there any chance you are pregnant?" "When was your last menstrual period?"       no 12. TRAVEL: "Have you traveled out of the country in the last month?" (e.g., travel history, exposures)       No  Protocols used: Breathing Difficulty-A-AH

## 2023-07-21 ENCOUNTER — Other Ambulatory Visit: Payer: Self-pay | Admitting: Family Medicine

## 2023-07-21 DIAGNOSIS — F410 Panic disorder [episodic paroxysmal anxiety] without agoraphobia: Secondary | ICD-10-CM

## 2023-08-09 ENCOUNTER — Other Ambulatory Visit: Payer: Self-pay | Admitting: Family Medicine

## 2023-08-11 ENCOUNTER — Other Ambulatory Visit: Payer: Self-pay

## 2023-08-11 MED ORDER — BUDESONIDE-FORMOTEROL FUMARATE 160-4.5 MCG/ACT IN AERO: 2.0000 | INHALATION_SPRAY | Freq: Two times a day (BID) | RESPIRATORY_TRACT | 3 refills | Status: DC

## 2023-08-11 MED ORDER — ATORVASTATIN CALCIUM 40 MG PO TABS: 40.0000 mg | ORAL_TABLET | Freq: Every day | ORAL | 1 refills | Status: DC

## 2023-10-29 ENCOUNTER — Ambulatory Visit (INDEPENDENT_AMBULATORY_CARE_PROVIDER_SITE_OTHER): Admitting: Family Medicine

## 2023-10-29 VITALS — BP 128/77 | HR 77 | Temp 97.3°F | Ht 65.0 in | Wt 146.2 lb

## 2023-10-29 DIAGNOSIS — E7801 Familial hypercholesterolemia: Secondary | ICD-10-CM

## 2023-10-29 DIAGNOSIS — K219 Gastro-esophageal reflux disease without esophagitis: Secondary | ICD-10-CM

## 2023-10-29 DIAGNOSIS — M62838 Other muscle spasm: Secondary | ICD-10-CM

## 2023-10-29 MED ORDER — PANTOPRAZOLE SODIUM 40 MG PO TBEC: 40.0000 mg | DELAYED_RELEASE_TABLET | Freq: Two times a day (BID) | ORAL | 1 refills | Status: DC

## 2023-10-29 MED ORDER — CYCLOBENZAPRINE HCL 5 MG PO TABS
5.0000 mg | ORAL_TABLET | Freq: Every evening | ORAL | 1 refills | Status: DC | PRN
Start: 2023-10-29 — End: 2024-01-28

## 2023-10-29 MED ORDER — ATORVASTATIN CALCIUM 40 MG PO TABS: 40.0000 mg | ORAL_TABLET | Freq: Every day | ORAL | 3 refills | Status: AC

## 2023-10-29 NOTE — Patient Instructions (Signed)
 Lipitor refilled.  Stop omeprazole.  Start Protonix  twice daily. If you continue to be symptomatic, will refer to GI.  Use muscle relaxer at night as directed.  Follow up in 3 months.

## 2023-10-29 NOTE — Progress Notes (Signed)
 Subjective:  Patient ID: Cassandra Hayden, female    DOB: [birth year]  Age: 73 y.o. MRN: [id number]  CC: Multiple issues   HPI:  73 year old female with familial hyperlipidemia, migraine, GERD, CKD, history of stroke presents with multiple complaints.  Patient states that there has been an issue with the pharmacy.  She is out of her Lipitor.  Needs refill.  Patient reports that she has states that he is waking up at night with acid.  States that she feels like aspirating.  She is currently using omeprazole without significant improvement.  Patient also states that she is having difficulty sleeping.  She attributes this to muscle spasms particularly in the back.  Patient Active Problem List   Diagnosis Date Noted   Night muscle spasms 10/29/2023   Syncope 03/29/2023   Asthma, chronic 03/29/2023   Panic attack 04/09/2022   Familial hypercholesteremia 02/11/2022   History of stroke 02/11/2022   History of seizure 02/11/2022   Bipolar disorder (HCC) 02/11/2022   CKD stage 3a, GFR 45-59 ml/min (HCC) 03/13/2021   Chronic migraine without aura without status migrainosus, not intractable 05/31/2019   Gastroesophageal reflux disease without esophagitis 05/31/2019   Seasonal allergic rhinitis 05/31/2019   Health care maintenance 05/31/2019    Social Hx   Social History   Socioeconomic History   Marital status: Widowed    Spouse name: Not on file   Number of children: Not on file   Years of education: Not on file   Highest education level: Associate degree: academic program  Occupational History   Occupation: retired  Tobacco Use   Smoking status: Former    Current packs/day: 0.00    Average packs/day: 1 pack/day for 15.0 years (15.0 ttl pk-yrs)    Types: Cigarettes    Start date: 05/30/1983    Quit date: 05/29/1998    Years since quitting: 25.4   Smokeless tobacco: Never   Tobacco comments:    I smoked off snd on from 1961 to 1999. I quit when pregnant or nursing. I wuit  between 1983 and 1988  Substance and Sexual Activity   Alcohol use: Never   Drug use: Never   Sexual activity: Not Currently    Birth control/protection: None    Comment: Not active  Other Topics Concern   Not on file  Social History Narrative   Left mainly can use right   Caffeine 20 oz soda a day   Lives with x husband in a one story home   Social Drivers of Health   Financial Resource Strain: Low Risk  (10/25/2022)   Overall Financial Resource Strain (CARDIA)    Difficulty of Paying Living Expenses: Not hard at all  Food Insecurity: No Food Insecurity (03/29/2023)   Hunger Vital Sign    Worried About Running Out of Food in the Last Year: Never true    Ran Out of Food in the Last Year: Never true  Transportation Needs: No Transportation Needs (03/29/2023)   PRAPARE - Administrator, Civil Service (Medical): No    Lack of Transportation (Non-Medical): No  Physical Activity: Sufficiently Active (10/25/2022)   Exercise Vital Sign    Days of Exercise per Week: 7 days    Minutes of Exercise per Session: 30 min  Stress: No Stress Concern Present (10/25/2022)   [employer] of Occupational Health - Occupational Stress Questionnaire    Feeling of Stress : Not at all  Social Connections: Socially Isolated (10/25/2022)   Social Connection and Isolation  Panel [NHANES]    Frequency of Communication with Friends and Family: More than three times a week    Frequency of Social Gatherings with Friends and Family: More than three times a week    Attends Religious Services: Never    Database administrator or Organizations: No    Attends Banker Meetings: Never    Marital Status: Widowed    Review of Systems Per HPI  Objective:  BP 128/77   Pulse 77   Temp (!) 97.3 F (36.3 C)   Ht 5\' 5"  (1.651 m)   Wt 146 lb 3.2 oz (66.3 kg)   SpO2 99%   BMI 24.33 kg/m      10/29/2023    2:28 PM 03/30/2023    3:57 AM 03/29/2023    8:42 PM  BP/Weight  Systolic BP 128  161 160  Diastolic BP 77 110 99  Wt. (Lbs) 146.2    BMI 24.33 kg/m2      Physical Exam Vitals and nursing note reviewed.  Constitutional:      General: She is not in acute distress.    Appearance: Normal appearance.  HENT:     Head: Normocephalic and atraumatic.  Cardiovascular:     Rate and Rhythm: Normal rate and regular rhythm.  Pulmonary:     Effort: Pulmonary effort is normal.     Breath sounds: Normal breath sounds.  Abdominal:     General: There is no distension.     Palpations: Abdomen is soft.     Tenderness: There is no abdominal tenderness.  Neurological:     Mental Status: She is alert.     Lab Results  Component Value Date   WBC 13.3 (H) 03/29/2023   HGB 10.2 (L) 03/29/2023   HCT 31.6 (L) 03/29/2023   PLT 272 03/29/2023   GLUCOSE 137 (H) 03/29/2023   CHOL 190 10/29/2022   TRIG 205 (H) 10/29/2022   HDL 52 10/29/2022   LDLCALC 103 (H) 10/29/2022   ALT 13 03/29/2023   AST 16 03/29/2023   NA 135 03/29/2023   K 4.6 03/29/2023   CL 99 03/29/2023   CREATININE 1.79 (H) 03/29/2023   BUN 35 (H) 03/29/2023   CO2 26 03/29/2023   TSH 0.77 06/21/2019   INR 1.0 03/12/2021   HGBA1C 5.2 03/13/2021     Assessment & Plan:  Gastroesophageal reflux disease without esophagitis Assessment & Plan: Uncontrolled/exacerbation.  Stopping omeprazole.  Starting Protonix  40 mg twice daily.  If continues to be problematic will need referral to GI for endoscopy.  Orders: -     Pantoprazole  Sodium; Take 1 tablet (40 mg total) by mouth 2 (two) times daily before a meal.  Dispense: 180 tablet; Refill: 1  Familial hypercholesteremia Assessment & Plan: Lipitor refilled.   Orders: -     Atorvastatin  Calcium ; Take 1 tablet (40 mg total) by mouth daily.  Dispense: 90 tablet; Refill: 3  Night muscle spasms Assessment & Plan: Flexeril as directed.  Orders: -     Cyclobenzaprine HCl; Take 1-2 tablets (5-10 mg total) by mouth at bedtime as needed for muscle spasms.  Dispense:  90 tablet; Refill: 1    Follow-up:  Return in about 3 months (around 01/28/2024).  Kathleen Papa DO [facility]

## 2023-10-29 NOTE — Assessment & Plan Note (Signed)
 Flexeril as directed.

## 2023-10-29 NOTE — Assessment & Plan Note (Signed)
 Lipitor refilled.

## 2023-10-29 NOTE — Assessment & Plan Note (Signed)
 Uncontrolled/exacerbation.  Stopping omeprazole.  Starting Protonix  40 mg twice daily.  If continues to be problematic will need referral to GI for endoscopy.

## 2023-10-31 ENCOUNTER — Ambulatory Visit (INDEPENDENT_AMBULATORY_CARE_PROVIDER_SITE_OTHER): Payer: Medicare HMO

## 2023-10-31 VITALS — Ht 65.0 in | Wt 146.0 lb

## 2023-10-31 DIAGNOSIS — Z0000 Encounter for general adult medical examination without abnormal findings: Secondary | ICD-10-CM

## 2023-10-31 NOTE — Progress Notes (Signed)
 Subjective:   Cassandra Hayden is a 73 y.o. who presents for a Medicare Wellness preventive visit.  Visit Complete: Virtual I connected with  [name] on 10/31/23 by a audio enabled telemedicine application and verified that I am speaking with the correct person using two identifiers.  Patient Location: Home  Provider Location: Home Office  I discussed the limitations of evaluation and management by telemedicine. The patient expressed understanding and agreed to proceed.  Vital Signs: Because this visit was a virtual/telehealth visit, some criteria may be missing or patient reported. Any vitals not documented were not able to be obtained and vitals that have been documented are patient reported.  VideoDeclined- This patient declined Librarian, academic. Therefore the visit was completed with audio only.  Persons Participating in Visit: Patient.  AWV Questionnaire: No: Patient Medicare AWV questionnaire was not completed prior to this visit.  Cardiac Risk Factors include: advanced age (>1men, >110 women)     Objective:    Today's Vitals   10/31/23 1407  Weight: 146 lb (66.2 kg)  Height: 5\' 5"  (1.651 m)   Body mass index is 24.3 kg/m.     10/31/2023    2:20 PM 03/29/2023    6:14 PM 03/29/2023    2:23 PM 10/25/2022    1:57 PM 01/04/2022    1:13 PM  Advanced Directives  Does Patient Have a Medical Advance Directive? No No No Yes No  Type of Advance Directive    Living will;Healthcare Power of Attorney   Does patient want to make changes to medical advance directive?    No - Patient declined   Copy of Healthcare Power of Attorney in Chart?    No - copy requested   Would patient like information on creating a medical advance directive? Yes (MAU/Ambulatory/Procedural Areas - Information given) No - Patient declined       Current Medications (verified) Outpatient Encounter Medications as of 10/31/2023  Medication Sig   albuterol  (VENTOLIN  HFA) 108 (90  Base) MCG/ACT inhaler INHALE 2 PUFFS INTO THE LUNGS EVERY 6 HOURS AS NEEDED FOR WHEEZING   aspirin  EC 81 MG tablet Take 81 mg by mouth daily. Swallow whole.   atorvastatin  (LIPITOR) 40 MG tablet Take 1 tablet (40 mg total) by mouth daily.   B Complex-C (SUPER B COMPLEX PO) Take 1 tablet by mouth daily.   Cholecalciferol (D-3-5) 125 MCG (5000 UT) capsule Take 5,000 Units by mouth daily.   cyclobenzaprine  (FLEXERIL ) 5 MG tablet Take 1-2 tablets (5-10 mg total) by mouth at bedtime as needed for muscle spasms.   EPINEPHrine (EPIPEN 2-PAK IJ) Inject 0.3 mg as directed once as needed (allergic reaction).   lamoTRIgine  (LAMICTAL ) 100 MG tablet Take 100 mg by mouth daily as needed (for seizure activity).   LORATADINE PO Take 10 mg by mouth daily.   magnesium gluconate (MAGONATE) 500 MG tablet Take 500 mg by mouth 2 (two) times daily.   Multiple Vitamins-Minerals (MULTIVITAMIN WITH MINERALS) tablet Take 1 tablet by mouth daily.   OVER THE COUNTER MEDICATION COQ 10   pantoprazole  (PROTONIX ) 40 MG tablet Take 1 tablet (40 mg total) by mouth 2 (two) times daily before a meal.   propranolol  ER (INDERAL  LA) 80 MG 24 hr capsule Take 80 mg by mouth daily.   traZODone  (DESYREL ) 50 MG tablet Take 200 mg by mouth at bedtime as needed for sleep.   vitamin E 400 UNIT capsule Take 400 Units by mouth daily.   levalbuterol  (XOPENEX ) 1.25  MG/3ML nebulizer solution Take 1.25 mg by nebulization every 4 (four) hours as needed for wheezing.   No facility-administered encounter medications on file as of 10/31/2023.    Allergies (verified) Canola oil [vegetable oil], Cephalosporins, Ergotamine, Fluogen [influenza virus vaccine], Iodinated contrast media, Iodine, Penicillins, Pneumovax [pneumococcal polysaccharide vaccine], Povidone-iodine, Quinine, Sulfa antibiotics, Acacia, Beef-derived drug products, Pineapple, and Yeast   History: Past Medical History:  Diagnosis Date   Acute right-sided low back pain with right-sided  sciatica 10/30/2022   Allergy    Anaphylaxis    Anemia Most of my life   Anxiety    Arthritis    Asthma    Chronic kidney disease 2012   I had kidney failure from a medical mistake now have CKD stage 3b   GERD (gastroesophageal reflux disease)    Hyperlipidemia 2 years ago   On statin and it is better i have FH   Seizures (HCC)    Stroke (HCC) 03-2021   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 No pertinent surgical history. Family History  Problem Relation Age of Onset   Cancer Mother    Hyperlipidemia Mother    Hypertension Mother    Arthritis Mother    Heart disease Father    Mental illness Father    Alcohol abuse Father    Parkinsonism Maternal Aunt    Migraines Granddaughter    Heart attack Son    Hyperlipidemia Maternal Grandmother    Stroke Maternal Grandmother    Early death Paternal Grandmother    Social History   Socioeconomic History   Marital status: Widowed    Spouse name: Not on file   Number of children: Not on file   Years of education: Not on file   Highest education level: Associate degree: academic program  Occupational History   Occupation: retired  Tobacco Use   Smoking status: Former    Current packs/day: 0.00    Average packs/day: 1 pack/day for 15.0 years (15.0 ttl pk-yrs)    Types: Cigarettes    Start date: 05/30/1983    Quit date: 05/29/1998    Years since quitting: 25.4   Smokeless tobacco: Never   Tobacco comments:    I smoked off snd on from 1961 to 1999. I quit when pregnant or nursing. I wuit between 1983 and 1988  Substance and Sexual Activity   Alcohol use: Never   Drug use: Never   Sexual activity: Not Currently    Birth control/protection: None    Comment: Not active  Other Topics Concern   Not on file  Social History Narrative   Left mainly can use right   Caffeine 20 oz soda a day   Lives with x  husband in a one story home   Social Drivers of Health   Financial Resource Strain: Low Risk  (10/31/2023)   Overall Financial Resource Strain (CARDIA)    Difficulty of Paying Living Expenses: Not hard at all  Food Insecurity: No Food Insecurity (10/31/2023)   Hunger Vital Sign    Worried About Running Out of Food in the Last Year: Never true    Ran Out of Food in the Last Year: Never true  Transportation Needs: No Transportation Needs (10/31/2023)   PRAPARE - Administrator, Civil Service (Medical): No    Lack of Transportation (Non-Medical): No  Physical Activity: Sufficiently  Active (10/31/2023)   Exercise Vital Sign    Days of Exercise per Week: 5 days    Minutes of Exercise per Session: 30 min  Stress: No Stress Concern Present (10/31/2023)   [employer] of Occupational Health - Occupational Stress Questionnaire    Feeling of Stress : Not at all  Social Connections: Socially Isolated (10/31/2023)   Social Connection and Isolation Panel [NHANES]    Frequency of Communication with Friends and Family: More than three times a week    Frequency of Social Gatherings with Friends and Family: More than three times a week    Attends Religious Services: Never    Database administrator or Organizations: No    Attends Banker Meetings: Never    Marital Status: Widowed    Tobacco Counseling Counseling given: Not Answered Tobacco comments: I smoked off snd on from 1961 to 1999. I quit when pregnant or nursing. I wuit between 1983 and 1988    Clinical Intake:  Pre-visit preparation completed: Yes  Pain : No/denies pain     Diabetes: No  Lab Results  Component Value Date   HGBA1C 5.2 03/13/2021     How often do you need to have someone help you when you read instructions, pamphlets, or other written materials from your doctor or pharmacy?: 1 - Never  Interpreter Needed?: No  Information entered by :: Seabron Cypress LPN   Activities of Daily Living      10/31/2023    2:19 PM 03/29/2023    6:14 PM  In your present state of health, do you have any difficulty performing the following activities:  Hearing? 0 0  Vision? 0 0  Difficulty concentrating or making decisions? 0 0  Walking or climbing stairs? 0   Dressing or bathing? 0   Doing errands, shopping? 0 0  Preparing Food and eating ? N   Using the Toilet? N   In the past six months, have you accidently leaked urine? N   Do you have problems with loss of bowel control? N   Managing your Medications? N   Managing your Finances? N   Housekeeping or managing your Housekeeping? N     Patient Care Team: Cook, Jayce G, DO as PCP - General (Family Medicine) Lobao, Celso as Psychiatrist  Indicate any recent Medical Services you may have received from other than [facility] providers in the past year (date may be approximate).     Assessment:   This is a routine wellness examination for Cassandra Hayden.  Hearing/Vision screen Hearing Screening - Comments:: Denies hearing difficulties   Vision Screening - Comments:: No vision problems; will schedule routine eye exam soon     Goals Addressed             This Visit's Progress    Remain active and independent   On track      Depression Screen     10/31/2023    2:16 PM 10/29/2023    2:34 PM 10/29/2022    3:04 PM 10/25/2022    1:56 PM 02/11/2022   10:10 AM 05/31/2019   11:07 AM  PHQ 2/9 Scores  PHQ - 2 Score 0 0 0 0 0 0  PHQ- 9 Score 3 3 0       Fall Risk     10/31/2023    2:18 PM 10/29/2023    2:33 PM 10/29/2022    3:04 PM 10/25/2022    1:11 PM 10/24/2022    1:34 PM  Fall Risk   Falls in the past year? 0 0 0 0 0  Number falls in past yr: 0  0 0 0  Injury with Fall? 0  0 0 0  Risk for fall due to : No Fall Risks  No Fall Risks No Fall Risks   Follow up Falls prevention discussed;Education provided;Falls evaluation completed  Falls evaluation completed Falls prevention discussed;Education provided;Falls evaluation completed      MEDICARE RISK AT HOME:  Medicare Risk at Home Any stairs in or around the home?: No If so, are there any without handrails?: No Home free of loose throw rugs in walkways, pet beds, electrical cords, etc?: Yes Adequate lighting in your home to reduce risk of falls?: Yes Life alert?: No Use of a cane, walker or w/c?: No Grab bars in the bathroom?: Yes Shower chair or bench in shower?: No Elevated toilet seat or a handicapped toilet?: Yes  TIMED UP AND GO:  Was the test performed?  No   Cognitive Function: 6CIT completed        10/31/2023    2:19 PM 10/25/2022    1:57 PM  6CIT Screen  What Year? 0 points 0 points  What month? 0 points 0 points  What time? 0 points 0 points  Count back from 20 0 points 0 points  Months in reverse 0 points 0 points  Repeat phrase 0 points 0 points  Total Score 0 points 0 points    Immunizations Immunization History  Administered Date(s) Administered   Moderna Sars-Covid-2 Vaccination 01/17/2020, 02/12/2020, 08/24/2020    Screening Tests Health Maintenance  Topic Date Due   Hepatitis C Screening  Never done   DTaP/Tdap/Td (1 - Tdap) Never done   Zoster Vaccines- Shingrix (1 of 2) Never done   Colonoscopy  Never done   DEXA SCAN  Never done   COVID-19 Vaccine (4 - 2024-25 season) 11/14/2023 (Originally 03/02/2023)   INFLUENZA VACCINE  01/30/2024   Medicare Annual Wellness (AWV)  10/30/2024   HPV VACCINES  Aged Out   Meningococcal B Vaccine  Aged Out   Pneumonia Vaccine 53+ Years old  Discontinued    Health Maintenance  Health Maintenance Due  Topic Date Due   Hepatitis C Screening  Never done   DTaP/Tdap/Td (1 - Tdap) Never done   Zoster Vaccines- Shingrix (1 of 2) Never done   Colonoscopy  Never done   DEXA SCAN  Never done   Health Maintenance Items Addressed: Patient declines all vaccines and preventive screenings at this time   Additional Screening:  Vision Screening: Recommended annual ophthalmology exams for early  detection of glaucoma and other disorders of the eye.  Dental Screening: Recommended annual dental exams for proper oral hygiene  Community Resource Referral / Chronic Care Management: CRR required this visit?  No   CCM required this visit?  No     Plan:     I have personally reviewed and noted the following in the patient's chart:   Medical and social history Use of alcohol, tobacco or illicit drugs  Current medications and supplements including opioid prescriptions. Patient is not currently taking opioid prescriptions. Functional ability and status Nutritional status Physical activity Advanced directives List of other physicians Hospitalizations, surgeries, and ER visits in previous 12 months Vitals Screenings to include cognitive, depression, and falls Referrals and appointments  In addition, I have reviewed and discussed with patient certain preventive protocols, quality metrics, and best practice recommendations. A written personalized care plan for preventive  services as well as general preventive health recommendations were provided to patient.     Seabron Cypress [place], [place]   07/06/1094   After Visit Summary: (MyChart) Due to this being a telephonic visit, the after visit summary with patients personalized plan was offered to patient via MyChart   Notes: Nothing significant to report at this time.

## 2023-10-31 NOTE — Patient Instructions (Signed)
 Cassandra Hayden , Thank you for taking time to come for your Medicare Wellness Visit. I appreciate your ongoing commitment to your health goals. Please review the following plan we discussed and let me know if I can assist you in the future.   Referrals/Orders/Follow-Ups/Clinician Recommendations: Aim for 30 minutes of exercise or brisk walking, 6-8 glasses of water, and 5 servings of fruits and vegetables each day.  This is a list of the screening recommended for you and due dates:  Health Maintenance  Topic Date Due   Hepatitis C Screening  Never done   DTaP/Tdap/Td vaccine (1 - Tdap) Never done   Zoster (Shingles) Vaccine (1 of 2) Never done   Colon Cancer Screening  Never done   DEXA scan (bone density measurement)  Never done   COVID-19 Vaccine (4 - 2024-25 season) 11/14/2023*   Flu Shot  01/30/2024   Medicare Annual Wellness Visit  10/30/2024   HPV Vaccine  Aged Out   Meningitis B Vaccine  Aged Out   Pneumonia Vaccine  Discontinued  *Topic was postponed. The date shown is not the original due date.    Advanced directives: (ACP Link)Information on Advanced Care Planning can be found at [place]  Secretary of [facility] Advance Health Care Directives Advance Health Care Directives. [email or web address]   Next Medicare Annual Wellness Visit scheduled for next year: Yes  Have you seen your provider in the last 6 months (3 months if uncontrolled diabetes)? Yes

## 2024-01-28 ENCOUNTER — Ambulatory Visit: Admitting: Family Medicine

## 2024-01-28 VITALS — BP 129/82 | HR 75 | Temp 99.1°F | Ht 65.0 in | Wt 144.3 lb

## 2024-01-28 DIAGNOSIS — E7801 Familial hypercholesterolemia: Secondary | ICD-10-CM

## 2024-01-28 DIAGNOSIS — F410 Panic disorder [episodic paroxysmal anxiety] without agoraphobia: Secondary | ICD-10-CM

## 2024-01-28 DIAGNOSIS — N1831 Chronic kidney disease, stage 3a: Secondary | ICD-10-CM | POA: Diagnosis not present

## 2024-01-28 DIAGNOSIS — T7840XD Allergy, unspecified, subsequent encounter: Secondary | ICD-10-CM

## 2024-01-28 DIAGNOSIS — T7840XA Allergy, unspecified, initial encounter: Secondary | ICD-10-CM

## 2024-01-28 DIAGNOSIS — L601 Onycholysis: Secondary | ICD-10-CM

## 2024-01-28 DIAGNOSIS — M62838 Other muscle spasm: Secondary | ICD-10-CM | POA: Diagnosis not present

## 2024-01-28 DIAGNOSIS — L304 Erythema intertrigo: Secondary | ICD-10-CM

## 2024-01-28 MED ORDER — CLONAZEPAM 0.5 MG PO TABS
0.5000 mg | ORAL_TABLET | Freq: Two times a day (BID) | ORAL | 1 refills | Status: AC | PRN
Start: 2024-01-28 — End: ?

## 2024-01-28 MED ORDER — CYCLOBENZAPRINE HCL 5 MG PO TABS: 5.0000 mg | ORAL_TABLET | Freq: Every evening | ORAL | 1 refills | Status: AC | PRN

## 2024-01-28 MED ORDER — EPINEPHRINE 0.3 MG/0.3ML IJ SOAJ
0.3000 mg | INTRAMUSCULAR | 0 refills | Status: AC | PRN
Start: 2024-01-28 — End: ?

## 2024-01-28 MED ORDER — NYSTATIN 100000 UNIT/GM EX POWD
1.0000 | Freq: Three times a day (TID) | CUTANEOUS | 3 refills | Status: AC | PRN
Start: 2024-01-28 — End: ?

## 2024-01-28 NOTE — Patient Instructions (Addendum)
 Labs today.  I will send in medication for the nails once I see the kidney function (has to be adjusted).  Follow up in 6 months

## 2024-01-29 ENCOUNTER — Other Ambulatory Visit (HOSPITAL_COMMUNITY): Payer: Self-pay

## 2024-01-29 ENCOUNTER — Ambulatory Visit: Payer: Self-pay | Admitting: Family Medicine

## 2024-01-29 ENCOUNTER — Telehealth: Payer: Self-pay | Admitting: Pharmacy Technician

## 2024-01-29 DIAGNOSIS — L304 Erythema intertrigo: Secondary | ICD-10-CM | POA: Insufficient documentation

## 2024-01-29 DIAGNOSIS — L601 Onycholysis: Secondary | ICD-10-CM | POA: Insufficient documentation

## 2024-01-29 DIAGNOSIS — T7840XA Allergy, unspecified, initial encounter: Secondary | ICD-10-CM | POA: Insufficient documentation

## 2024-01-29 LAB — LIPID PANEL
Chol/HDL Ratio: 3.8 ratio (ref 0.0–4.4)
Cholesterol, Total: 170 mg/dL (ref 100–199)
HDL: 45 mg/dL (ref >39)
LDL Chol Calc (NIH): 89 mg/dL (ref 0–99)
Triglycerides: 211 mg/dL — ABNORMAL HIGH (ref 0–149)
VLDL Cholesterol Cal: 36 mg/dL (ref 5–40)

## 2024-01-29 LAB — CBC
Hematocrit: 35.8 % (ref 34.0–46.6)
Hemoglobin: 11.2 g/dL (ref 11.1–15.9)
MCH: 27.4 pg (ref 26.6–33.0)
MCHC: 31.3 g/dL — ABNORMAL LOW (ref 31.5–35.7)
MCV: 88 fL (ref 79–97)
Platelets: 315 x10E3/uL (ref 150–450)
RBC: 4.09 x10E6/uL (ref 3.77–5.28)
RDW: 14 % (ref 11.7–15.4)
WBC: 9 x10E3/uL (ref 3.4–10.8)

## 2024-01-29 LAB — CMP14+EGFR
ALT: 11 IU/L (ref 0–32)
AST: 14 IU/L (ref 0–40)
Albumin: 4.6 g/dL (ref 3.8–4.8)
Alkaline Phosphatase: 76 IU/L (ref 44–121)
BUN/Creatinine Ratio: 10 — ABNORMAL LOW (ref 12–28)
BUN: 17 mg/dL (ref 8–27)
Bilirubin Total: 0.5 mg/dL (ref 0.0–1.2)
CO2: 25 mmol/L (ref 20–29)
Calcium: 9.3 mg/dL (ref 8.7–10.3)
Chloride: 102 mmol/L (ref 96–106)
Creatinine, Ser: 1.73 mg/dL — ABNORMAL HIGH (ref 0.57–1.00)
Globulin, Total: 2.4 g/dL (ref 1.5–4.5)
Glucose: 105 mg/dL — ABNORMAL HIGH (ref 70–99)
Potassium: 4.4 mmol/L (ref 3.5–5.2)
Sodium: 143 mmol/L (ref 134–144)
Total Protein: 7 g/dL (ref 6.0–8.5)
eGFR: 30 mL/min/1.73 — ABNORMAL LOW (ref >59)

## 2024-01-29 LAB — MICROALBUMIN / CREATININE URINE RATIO
Creatinine, Urine: 225.2 mg/dL
Microalb/Creat Ratio: 51 mg/g{creat} — ABNORMAL HIGH (ref 0–29)
Microalbumin, Urine: 115.4 ug/mL

## 2024-01-29 NOTE — Assessment & Plan Note (Signed)
Labs today to assess renal function.

## 2024-01-29 NOTE — Assessment & Plan Note (Signed)
Lipid panel today to assess.  Continue Lipitor. 

## 2024-01-29 NOTE — Assessment & Plan Note (Signed)
 Concern for onychomycosis.  Need current renal function to be able to dose terbinafine.  Awaiting labs.

## 2024-01-29 NOTE — Assessment & Plan Note (Signed)
Nystatin powdersent in.

## 2024-01-29 NOTE — Telephone Encounter (Signed)
 Pharmacy Patient Advocate Encounter   Received notification from CoverMyMeds that prior authorization for EPINEPHrine  0.3MG /0.3ML auto-injectors is required/requested.   Insurance verification completed.   The patient is insured through [employer] .   Per test claim: The current 30 day co-pay is, $10.00.  No PA needed at this time. This test claim was processed through [facility]- copay amounts may vary at other pharmacies due to pharmacy/plan contracts, or as the patient moves through the different stages of their insurance plan.

## 2024-01-29 NOTE — Assessment & Plan Note (Signed)
 Stable currently.  Clonazepam  refilled.  She uses this infrequently.

## 2024-01-29 NOTE — Assessment & Plan Note (Signed)
 Epi Pen refilled

## 2024-01-29 NOTE — Progress Notes (Signed)
 Subjective:  Patient ID: Cassandra Hayden, female    DOB: 1951/04/08  Age: 73 y.o. MRN: [id number]  CC:  Follow up   HPI:  73 year old female presents for follow-up.  Patient has a history of panic attacks and needs refill on clonazepam .  Patient states that she is currently experiencing a rash underneath her left breast due to the heat.  Requesting nystatin  powder.  Patient needs refill on EpiPen .  She has had severe allergic reactions previously.  Patient needs labs today.  Needs reassessment of renal function we need to evaluate her lipids.  Has previously been seen by nephrology regarding chronic disease.  Patient reports issues with her fingernails.  She states that multiple fingernails are pulling away from the nailbed itself.  She is concerned that she may have onychomycosis.  Patient Active Problem List   Diagnosis Date Noted   Allergic reaction 01/29/2024   Intertrigo 01/29/2024   Onycholysis 01/29/2024   Night muscle spasms 10/29/2023   Asthma, chronic 03/29/2023   Panic attack 04/09/2022   Familial hypercholesteremia 02/11/2022   History of stroke 02/11/2022   History of seizure 02/11/2022   Bipolar disorder (HCC) 02/11/2022   CKD stage 3a, GFR 45-59 ml/min (HCC) 03/13/2021   Chronic migraine without aura without status migrainosus, not intractable 05/31/2019   Gastroesophageal reflux disease without esophagitis 05/31/2019   Seasonal allergic rhinitis 05/31/2019   Health care maintenance 05/31/2019    Social Hx   Social History   Socioeconomic History   Marital status: Widowed    Spouse name: Not on file   Number of children: Not on file   Years of education: Not on file   Highest education level: Associate degree: academic program  Occupational History   Occupation: retired  Tobacco Use   Smoking status: Former    Current packs/day: 0.00    Average packs/day: 1 pack/day for 15.0 years (15.0 ttl pk-yrs)    Types: Cigarettes    Start date: 05/30/1983     Quit date: 05/29/1998    Years since quitting: 25.6   Smokeless tobacco: Never   Tobacco comments:    I smoked off snd on from 1961 to 1999. I quit when pregnant or nursing. I wuit between 1983 and 1988  Substance and Sexual Activity   Alcohol use: Never   Drug use: Never   Sexual activity: Not Currently    Birth control/protection: None    Comment: Not active  Other Topics Concern   Not on file  Social History Narrative   Left mainly can use right   Caffeine 20 oz soda a day   Lives with x husband in a one story home   Social Drivers of Health   Financial Resource Strain: Low Risk  (01/26/2024)   Overall Financial Resource Strain (CARDIA)    Difficulty of Paying Living Expenses: Not hard at all  Food Insecurity: No Food Insecurity (01/26/2024)   Hunger Vital Sign    Worried About Running Out of Food in the Last Year: Never true    Ran Out of Food in the Last Year: Never true  Transportation Needs: No Transportation Needs (01/26/2024)   PRAPARE - Administrator, Civil Service (Medical): No    Lack of Transportation (Non-Medical): No  Physical Activity: Insufficiently Active (01/26/2024)   Exercise Vital Sign    Days of Exercise per Week: 2 days    Minutes of Exercise per Session: 10 min  Stress: No Stress Concern Present (01/26/2024)   [place]  [employer]ational [employer] - Occupational Stress Questionnaire    Feeling of Stress: Not at all  Social Connections: Socially Isolated (01/26/2024)   Social Connection and Isolation Panel    Frequency of Communication with Friends and Family: Once a week    Frequency of Social Gatherings with Friends and Family: Once a week    Attends Religious Services: Never    Diplomatic Services operational officer: No    Attends Engineer, structural: Not on file    Marital Status: Living with partner    Review of Systems Per HPI  Objective:  BP 129/82   Pulse 75   Temp 99.1 F (37.3 C)   Ht 5' 5 (1.651 m)   Wt  144 lb 4.8 oz (65.5 kg)   SpO2 96%   BMI 24.01 kg/m      01/28/2024    1:07 PM 10/31/2023    2:07 PM 10/29/2023    2:28 PM  BP/Weight  Systolic BP 129 -- 128  Diastolic BP 82 -- 77  Wt. (Lbs) 144.3 146 146.2  BMI 24.01 kg/m2 24.3 kg/m2 24.33 kg/m2    Physical Exam Vitals and nursing note reviewed.  Constitutional:      General: She is not in acute distress.    Appearance: Normal appearance.  HENT:     Head: Normocephalic and atraumatic.  Cardiovascular:     Rate and Rhythm: Normal rate and regular rhythm.  Pulmonary:     Effort: Pulmonary effort is normal.     Breath sounds: Normal breath sounds. No wheezing, rhonchi or rales.  Skin:    Comments: Patient has a few digits where the nail appears to be separating from the skin.  Some evidence of hyperkeratosis.  Questionable onychomycosis.  Neurological:     Mental Status: She is alert.  Psychiatric:        Mood and Affect: Mood normal.        Behavior: Behavior normal.     Lab Results  Component Value Date   WBC 9.0 01/28/2024   HGB 11.2 01/28/2024   HCT 35.8 01/28/2024   PLT 315 01/28/2024   GLUCOSE 105 (H) 01/28/2024   CHOL 170 01/28/2024   TRIG 211 (H) 01/28/2024   HDL 45 01/28/2024   LDLCALC 89 01/28/2024   ALT 11 01/28/2024   AST 14 01/28/2024   NA 143 01/28/2024   K 4.4 01/28/2024   CL 102 01/28/2024   CREATININE 1.73 (H) 01/28/2024   BUN 17 01/28/2024   CO2 25 01/28/2024   TSH 0.77 06/21/2019   INR 1.0 03/12/2021   HGBA1C 5.2 03/13/2021     Assessment & Plan:  CKD stage 3a, GFR 45-59 ml/min (HCC) Assessment & Plan: Labs today to assess renal function.  Orders: -     CBC -     CMP14+EGFR -     Microalbumin / creatinine urine ratio  Familial hypercholesteremia Assessment & Plan: Lipid panel today to assess.  Continue Lipitor.  Orders: -     Lipid panel  Night muscle spasms -     Cyclobenzaprine  HCl; Take 1-2 tablets (5-10 mg total) by mouth at bedtime as needed for muscle spasms.   Dispense: 90 tablet; Refill: 1  Panic attack Assessment & Plan: Stable currently.  Clonazepam  refilled.  She uses this infrequently.  Orders: -     clonazePAM ; Take 1 tablet (0.5 mg total) by mouth 2 (two) times daily as needed for anxiety (Panic attack).  Dispense: 20 tablet;  Refill: 1  Allergic reaction, initial encounter Assessment & Plan: EpiPen  refilled.  Orders: -     EPINEPHrine ; Inject 0.3 mg into the muscle as needed (allergic reaction).  Dispense: 2 each; Refill: 0  Intertrigo Assessment & Plan: Nystatin  powder sent in.  Orders: -     Nystatin ; Apply 1 Application topically 3 (three) times daily as needed.  Dispense: 60 g; Refill: 3  Onycholysis Assessment & Plan: Concern for onychomycosis.  Need current renal function to be able to dose terbinafine.  Awaiting labs.     Follow-up:  Return in about 6 months (around 07/30/2024).  Jacqulyn Ahle DO [facility]

## 2024-02-03 ENCOUNTER — Other Ambulatory Visit: Payer: Self-pay | Admitting: Family Medicine

## 2024-02-03 ENCOUNTER — Encounter: Payer: Self-pay | Admitting: Family Medicine

## 2024-02-03 MED ORDER — EMPAGLIFLOZIN 10 MG PO TABS: 10.0000 mg | ORAL_TABLET | Freq: Every day | ORAL | 1 refills | Status: DC

## 2024-02-03 MED ORDER — TERBINAFINE HCL 250 MG PO TABS: ORAL_TABLET | ORAL | 0 refills | Status: DC

## 2024-03-23 ENCOUNTER — Ambulatory Visit: Payer: Self-pay | Admitting: *Deleted

## 2024-03-23 NOTE — Telephone Encounter (Signed)
 FYI Only or Action Required?: FYI only for provider.  Patient was last seen in primary care on 01/28/2024 by Cook, Jayce G, DO.  Called Nurse Triage reporting urinary pain (frequency).  Symptoms began several days ago.  Interventions attempted: Nothing.  Symptoms are: gradually worsening.  Triage Disposition: See Physician Within 24 Hours  Patient/caregiver understands and will follow disposition?: Yes   Reason for Disposition  Age > 50 years  Answer Assessment - Initial Assessment Questions 1. SEVERITY: How bad is the pain?  (e.g., Scale 1-10; mild, moderate, or severe)     Burning today 2. FREQUENCY: How many times have you had painful urination today?      Urgency/frequency, small amounts 3. PATTERN: Is pain present every time you urinate or just sometimes?      Yes- twice 4. ONSET: When did the painful urination start?      Saturday 5. FEVER: Do you have a fever? If Yes, ask: What is your temperature, how was it measured, and when did it start?     no 6. PAST UTI: Have you had a urine infection before? If Yes, ask: When was the last time? and What happened that time?      Yes- 2020 7. CAUSE: What do you think is causing the painful urination?  (e.g., UTI, scratch, Herpes sore)     UTI 8. OTHER SYMPTOMS: Do you have any other symptoms? (e.g., blood in urine, flank pain, genital sores, urgency, vaginal discharge)     urgency  Protocols used: Urination Pain - [facility]  Copied from CRM #[id number]. Topic: Clinical - Red Word Triage >> Mar 23, 2024  2:54 PM Mia F wrote: Red Word that prompted transfer to Nurse Triage: Possible UTI. Pt says she is experiencing urgency with little output along with some burning. Has been going on since Saturday. Stage 3 chronic kidney disease.

## 2024-03-24 ENCOUNTER — Ambulatory Visit (INDEPENDENT_AMBULATORY_CARE_PROVIDER_SITE_OTHER): Admitting: Family Medicine

## 2024-03-24 VITALS — BP 127/78 | Ht 65.0 in | Wt 144.0 lb

## 2024-03-24 DIAGNOSIS — R3915 Urgency of urination: Secondary | ICD-10-CM

## 2024-03-24 DIAGNOSIS — R45851 Suicidal ideations: Secondary | ICD-10-CM | POA: Insufficient documentation

## 2024-03-24 DIAGNOSIS — N184 Chronic kidney disease, stage 4 (severe): Secondary | ICD-10-CM

## 2024-03-24 DIAGNOSIS — F4329 Adjustment disorder with other symptoms: Secondary | ICD-10-CM | POA: Insufficient documentation

## 2024-03-24 LAB — POCT URINALYSIS DIPSTICK
Bilirubin, UA: NEGATIVE
Blood, UA: NEGATIVE
Glucose, UA: NEGATIVE
Ketones, UA: NEGATIVE
Nitrite, UA: NEGATIVE
Protein, UA: NEGATIVE — AB
Spec Grav, UA: 1.01 (ref 1.010–1.025)
Urobilinogen, UA: 0.2 U/dL
pH, UA: 6.5 (ref 5.0–8.0)

## 2024-03-24 MED ORDER — CIPROFLOXACIN HCL 500 MG PO TABS
500.0000 mg | ORAL_TABLET | Freq: Every day | ORAL | 0 refills | Status: AC
Start: 2024-03-24 — End: 2024-03-31

## 2024-03-24 NOTE — Progress Notes (Signed)
 Subjective:  Patient ID: Cassandra Hayden, female    DOB: [birth year]  Age: 73 y.o. MRN: [id number]  CC:   Chief Complaint  Patient presents with   Dysuria    HPI:  73 year old female presents with concerns for UTI.  Patient reports her symptoms started Saturday.  She reports urinary urgency and frequency.  No dysuria.  No abdominal pain.  She reports chronic left flank pain.  No fever.  Patient Active Problem List   Diagnosis Date Noted   CKD (chronic kidney disease) stage 4, GFR 15-29 ml/min (HCC) 03/24/2024   Urinary urgency 03/24/2024   Allergic reaction 01/29/2024   Intertrigo 01/29/2024   Onycholysis 01/29/2024   Night muscle spasms 10/29/2023   Asthma, chronic 03/29/2023   Panic attack 04/09/2022   Familial hypercholesteremia 02/11/2022   History of stroke 02/11/2022   History of seizure 02/11/2022   Bipolar disorder (HCC) 02/11/2022   Coronary atherosclerosis 11/16/2019   Seasonal allergies 11/15/2019   Chronic migraine without aura without status migrainosus, not intractable 05/31/2019   Gastroesophageal reflux disease without esophagitis 05/31/2019   Seasonal allergic rhinitis 05/31/2019   Health care maintenance 05/31/2019    Social Hx   Social History   Socioeconomic History   Marital status: Widowed    Spouse name: Not on file   Number of children: Not on file   Years of education: Not on file   Highest education level: Associate degree: academic program  Occupational History   Occupation: retired  Tobacco Use   Smoking status: Former    Current packs/day: 0.00    Average packs/day: 1 pack/day for 15.0 years (15.0 ttl pk-yrs)    Types: Cigarettes    Start date: 05/30/1983    Quit date: 05/29/1998    Years since quitting: 25.8   Smokeless tobacco: Never   Tobacco comments:    I smoked off snd on from 1961 to 1999. I quit when pregnant or nursing. I wuit between 1983 and 1988  Substance and Sexual Activity   Alcohol use: Never   Drug use: Never    Sexual activity: Not Currently    Birth control/protection: None    Comment: Not active  Other Topics Concern   Not on file  Social History Narrative   Left mainly can use right   Caffeine 20 oz soda a day   Lives with x husband in a one story home   Social Drivers of Health   Financial Resource Strain: Low Risk  (01/26/2024)   Overall Financial Resource Strain (CARDIA)    Difficulty of Paying Living Expenses: Not hard at all  Food Insecurity: No Food Insecurity (01/26/2024)   Hunger Vital Sign    Worried About Running Out of Food in the Last Year: Never true    Ran Out of Food in the Last Year: Never true  Transportation Needs: No Transportation Needs (01/26/2024)   PRAPARE - Administrator, Civil Service (Medical): No    Lack of Transportation (Non-Medical): No  Physical Activity: Insufficiently Active (01/26/2024)   Exercise Vital Sign    Days of Exercise per Week: 2 days    Minutes of Exercise per Session: 10 min  Stress: No Stress Concern Present (01/26/2024)   [employer] of Occupational Health - Occupational Stress Questionnaire    Feeling of Stress: Not at all  Social Connections: Socially Isolated (01/26/2024)   Social Connection and Isolation Panel    Frequency of Communication with Friends and Family: Once a week  Frequency of Social Gatherings with Friends and Family: Once a week    Attends Religious Services: Never    Database administrator or Organizations: No    Attends Engineer, structural: Not on file    Marital Status: Living with partner    Review of Systems Per HPI  Objective:  BP 127/78   Ht 5' 5 (1.651 m)   Wt 144 lb (65.3 kg)   BMI 23.96 kg/m      03/24/2024   10:04 AM 01/28/2024    1:07 PM 10/31/2023    2:07 PM  BP/Weight  Systolic BP 127 129 --  Diastolic BP 78 82 --  Wt. (Lbs) 144 144.3 146  BMI 23.96 kg/m2 24.01 kg/m2 24.3 kg/m2    Physical Exam Vitals and nursing note reviewed.  Constitutional:       General: She is not in acute distress.    Appearance: Normal appearance.  HENT:     Head: Normocephalic and atraumatic.  Cardiovascular:     Rate and Rhythm: Normal rate and regular rhythm.  Pulmonary:     Effort: Pulmonary effort is normal.     Breath sounds: Normal breath sounds. No wheezing or rales.  Abdominal:     General: There is no distension.     Palpations: Abdomen is soft.     Tenderness: There is no abdominal tenderness.  Neurological:     Mental Status: She is alert.     Lab Results  Component Value Date   WBC 9.0 01/28/2024   HGB 11.2 01/28/2024   HCT 35.8 01/28/2024   PLT 315 01/28/2024   GLUCOSE 105 (H) 01/28/2024   CHOL 170 01/28/2024   TRIG 211 (H) 01/28/2024   HDL 45 01/28/2024   LDLCALC 89 01/28/2024   ALT 11 01/28/2024   AST 14 01/28/2024   NA 143 01/28/2024   K 4.4 01/28/2024   CL 102 01/28/2024   CREATININE 1.73 (H) 01/28/2024   BUN 17 01/28/2024   CO2 25 01/28/2024   TSH 0.77 06/21/2019   INR 1.0 03/12/2021   HGBA1C 5.2 03/13/2021     Assessment & Plan:  Urinary urgency Assessment & Plan: UA consistent with entering infection.  Placing on Cipro  given patient's allergies and renal function.  Orders: -     POCT urinalysis dipstick -     Urine Culture -     Ciprofloxacin  HCl; Take 1 tablet (500 mg total) by mouth daily for 7 days.  Dispense: 7 tablet; Refill: 0  CKD (chronic kidney disease) stage 4, GFR 15-29 ml/min (HCC)    Follow-up: As previously scheduled  Sanjuana Mruk DO [facility]

## 2024-03-24 NOTE — Assessment & Plan Note (Signed)
 UA consistent with entering infection.  Placing on Cipro  given patient's allergies and renal function.

## 2024-03-28 ENCOUNTER — Ambulatory Visit: Payer: Self-pay | Admitting: Family Medicine

## 2024-03-28 LAB — URINE CULTURE

## 2024-05-18 ENCOUNTER — Ambulatory Visit: Payer: Self-pay

## 2024-05-18 NOTE — Telephone Encounter (Signed)
 FYI Only or Action Required?: FYI only for provider: appointment scheduled on tomorrow.  Patient was last seen in primary care on 03/24/2024 by Cook, Jayce G, DO.  Called Nurse Triage reporting Breathing Problem.  Symptoms began several days ago.  Interventions attempted: Prescription medications: neb.  Symptoms are: gradually worsening.  Triage Disposition: See HCP Within 4 Hours (Or PCP Triage)  Patient/caregiver understands and will follow disposition?: No, wishes to speak with PCP  Copied from CRM #[id number]. Topic: Clinical - Red Word Triage >> May 18, 2024  1:50 PM Joesph NOVAK wrote: Red Word that prompted transfer to Nurse Triage: Congested, wheezing, sob, coughing and thick phlegm. Patient started breathing treatments 2 days ago. Reason for Disposition  [1] MILD difficulty breathing (e.g., minimal/no SOB at rest, SOB with walking, pulse < 100) AND [2] NEW-onset or WORSE than normal  Answer Assessment - Initial Assessment Questions 1. RESPIRATORY STATUS: Describe your breathing? (e.g., wheezing, shortness of breath, unable to speak, severe coughing)      SOB, coughing fits, wheezing when not coughing, states that she has completed a neb treatment before speaking to NT 2. ONSET: When did this breathing problem begin?      2 days ago 3. PATTERN Does the difficult breathing come and go, or has it been constant since it started?      States she is only SOB with supine or exertionally, or post coughing spell 4. SEVERITY: How bad is your breathing? (e.g., mild, moderate, severe)      Mild when it occurs 5. RECURRENT SYMPTOM: Have you had difficulty breathing before? If Yes, ask: When was the last time? and What happened that time?      Asthma,  7. LUNG HISTORY: Do you have any history of lung disease?  (e.g., pulmonary embolus, asthma, emphysema)     Asthma,  8. CAUSE: What do you think is causing the breathing problem?      Cold 9. OTHER SYMPTOMS: Do you have any  other symptoms? (e.g., chest pain, cough, dizziness, fever, runny nose)     Cough,  10. O2 SATURATION MONITOR:  Do you use an oxygen saturation monitor (pulse oximeter) at home? If Yes, ask: What is your reading (oxygen level) today? What is your usual oxygen saturation reading? (e.g., 95%)       Pt is unsure  Pt states that she is coughing up thick white phlegm.  Protocols used: Breathing Difficulty-A-AH

## 2024-05-19 ENCOUNTER — Encounter: Payer: Self-pay | Admitting: Family Medicine

## 2024-05-19 ENCOUNTER — Ambulatory Visit: Payer: Self-pay | Admitting: Family Medicine

## 2024-05-19 VITALS — BP 114/66 | HR 76 | Temp 98.2°F | Ht 65.0 in | Wt 135.0 lb

## 2024-05-19 DIAGNOSIS — J45901 Unspecified asthma with (acute) exacerbation: Secondary | ICD-10-CM

## 2024-05-19 MED ORDER — PREDNISONE 10 MG (21) PO TBPK: ORAL_TABLET | ORAL | 0 refills | Status: AC

## 2024-05-19 NOTE — Assessment & Plan Note (Signed)
Treating with Prednisone.

## 2024-05-19 NOTE — Progress Notes (Signed)
 Subjective:  Patient ID: Cassandra Hayden, female    DOB: June 30, 1951  Age: 73 y.o. MRN: [id number]  CC:   Chief Complaint  Patient presents with   coughing with fits , hx of asthma     Using neb tx for wheezing w/ laying down  Hx of yearly bronchitis around winter    HPI:  73 year old female presents for evaluation of the above. Has Asthma.  5 days of cough. Cough is slightly productive. Associated wheezing. She has been using Albuterol  without resolution. No fever. No known inciting factor. No other complaints at this time.  Patient Active Problem List   Diagnosis Date Noted   Asthmatic bronchitis with acute exacerbation 05/19/2024   CKD (chronic kidney disease) stage 4, GFR 15-29 ml/min (HCC) 03/24/2024   Urinary urgency 03/24/2024   Allergic reaction 01/29/2024   Intertrigo 01/29/2024   Onycholysis 01/29/2024   Night muscle spasms 10/29/2023   Asthma, chronic 03/29/2023   Panic attack 04/09/2022   Familial hypercholesteremia 02/11/2022   History of stroke 02/11/2022   History of seizure 02/11/2022   Bipolar disorder (HCC) 02/11/2022   Coronary atherosclerosis 11/16/2019   Seasonal allergies 11/15/2019   Chronic migraine without aura without status migrainosus, not intractable 05/31/2019   Gastroesophageal reflux disease without esophagitis 05/31/2019   Seasonal allergic rhinitis 05/31/2019   Health care maintenance 05/31/2019    Social Hx   Social History   Socioeconomic History   Marital status: Widowed    Spouse name: Not on file   Number of children: Not on file   Years of education: Not on file   Highest education level: Associate degree: academic program  Occupational History   Occupation: retired  Tobacco Use   Smoking status: Former    Current packs/day: 0.00    Average packs/day: 1 pack/day for 15.0 years (15.0 ttl pk-yrs)    Types: Cigarettes    Start date: 05/30/1983    Quit date: 05/29/1998    Years since quitting: 25.9   Smokeless tobacco: Never    Tobacco comments:    I smoked off snd on from 1961 to 1999. I quit when pregnant or nursing. I wuit between 1983 and 1988  Substance and Sexual Activity   Alcohol use: Never   Drug use: Never   Sexual activity: Not Currently    Birth control/protection: None    Comment: Not active  Other Topics Concern   Not on file  Social History Narrative   Left mainly can use right   Caffeine 20 oz soda a day   Lives with x husband in a one story home   Social Drivers of Health   Financial Resource Strain: Low Risk  (05/19/2024)   Overall Financial Resource Strain (CARDIA)    Difficulty of Paying Living Expenses: Not hard at all  Food Insecurity: No Food Insecurity (05/19/2024)   Hunger Vital Sign    Worried About Running Out of Food in the Last Year: Never true    Ran Out of Food in the Last Year: Never true  Transportation Needs: No Transportation Needs (05/19/2024)   PRAPARE - Administrator, Civil Service (Medical): No    Lack of Transportation (Non-Medical): No  Physical Activity: Insufficiently Active (05/19/2024)   Exercise Vital Sign    Days of Exercise per Week: 4 days    Minutes of Exercise per Session: 30 min  Stress: No Stress Concern Present (05/19/2024)   [employer] of Occupational Health - Occupational Stress Questionnaire  Feeling of Stress: Not at all  Social Connections: Moderately Isolated (05/19/2024)   Social Connection and Isolation Panel    Frequency of Communication with Friends and Family: Twice a week    Frequency of Social Gatherings with Friends and Family: Once a week    Attends Religious Services: Never    Diplomatic Services Operational Officer: No    Attends Engineer, Structural: Not on file    Marital Status: Living with partner    Review of Systems Per HPI  Objective:  BP 114/66   Pulse 76   Temp 98.2 F (36.8 C)   Ht 5' 5 (1.651 m)   Wt 135 lb (61.2 kg)   SpO2 96%   BMI 22.47 kg/m      05/19/2024    10:38 AM 03/24/2024   10:04 AM 01/28/2024    1:07 PM  BP/Weight  Systolic BP 114 127 129  Diastolic BP 66 78 82  Wt. (Lbs) 135 144 144.3  BMI 22.47 kg/m2 23.96 kg/m2 24.01 kg/m2    Physical Exam Vitals and nursing note reviewed.  Constitutional:      General: She is not in acute distress.    Appearance: Normal appearance.  Cardiovascular:     Rate and Rhythm: Normal rate and regular rhythm.  Pulmonary:     Effort: Pulmonary effort is normal.     Breath sounds: Normal breath sounds. No wheezing or rales.  Neurological:     Mental Status: She is alert.     Lab Results  Component Value Date   WBC 9.0 01/28/2024   HGB 11.2 01/28/2024   HCT 35.8 01/28/2024   PLT 315 01/28/2024   GLUCOSE 105 (H) 01/28/2024   CHOL 170 01/28/2024   TRIG 211 (H) 01/28/2024   HDL 45 01/28/2024   LDLCALC 89 01/28/2024   ALT 11 01/28/2024   AST 14 01/28/2024   NA 143 01/28/2024   K 4.4 01/28/2024   CL 102 01/28/2024   CREATININE 1.73 (H) 01/28/2024   BUN 17 01/28/2024   CO2 25 01/28/2024   TSH 0.77 06/21/2019   INR 1.0 03/12/2021   HGBA1C 5.2 03/13/2021     Assessment & Plan:  Asthmatic bronchitis with acute exacerbation, unspecified asthma severity, unspecified whether persistent Assessment & Plan: Treating with Prednisone .  Orders: -     predniSONE ; 6 tablets on day 1; decrease by 1 tablet daily until gone.  Dispense: 21 tablet; Refill: 0    Follow-up:  Return if symptoms worsen or fail to improve.  Jacqulyn Ahle DO [facility]

## 2024-07-30 ENCOUNTER — Ambulatory Visit: Admitting: Family Medicine
